# Patient Record
Sex: Female | Born: 2000 | Hispanic: No | Marital: Single | Smoking: Current some day smoker
Health system: Southern US, Community
[De-identification: ages and names within clinical notes are randomized; demographics above are authoritative.]

---
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
## 2020-02-07 ENCOUNTER — Ambulatory Visit (INDEPENDENT_AMBULATORY_CARE_PROVIDER_SITE_OTHER): Payer: Medicaid Other | Admitting: *Deleted

## 2020-02-07 ENCOUNTER — Other Ambulatory Visit: Payer: Self-pay

## 2020-02-07 DIAGNOSIS — Z3042 Encounter for surveillance of injectable contraceptive: Secondary | ICD-10-CM

## 2020-02-17 MED ORDER — MEDROXYPROGESTERONE ACETATE 150 MG/ML IM SUSP
150.0000 mg | Freq: Once | INTRAMUSCULAR | Status: AC
  Administered 2020-02-07: 16:00:00 150 mg via INTRAMUSCULAR

## 2020-02-17 NOTE — Progress Notes (Addendum)
Depo only. NDC [phone]

## 2020-04-24 ENCOUNTER — Ambulatory Visit: Payer: Medicaid Other

## 2020-05-01 ENCOUNTER — Other Ambulatory Visit (HOSPITAL_COMMUNITY)
Admission: RE | Admit: 2020-05-01 | Discharge: 2020-05-01 | Disposition: A | Discharge status: Home / Self Care | Payer: Medicaid Other | Source: Ambulatory Visit | Attending: Pediatrics | Admitting: Pediatrics

## 2020-05-01 ENCOUNTER — Ambulatory Visit (INDEPENDENT_AMBULATORY_CARE_PROVIDER_SITE_OTHER): Payer: Medicaid Other | Admitting: Pediatrics

## 2020-05-01 ENCOUNTER — Ambulatory Visit (INDEPENDENT_AMBULATORY_CARE_PROVIDER_SITE_OTHER): Payer: Medicaid Other | Admitting: Clinical

## 2020-05-01 ENCOUNTER — Other Ambulatory Visit: Payer: Self-pay

## 2020-05-01 VITALS — BP 124/80 | HR 98 | Ht 61.81 in | Wt 177.0 lb

## 2020-05-01 DIAGNOSIS — Z113 Encounter for screening for infections with a predominantly sexual mode of transmission: Secondary | ICD-10-CM | POA: Insufficient documentation

## 2020-05-01 DIAGNOSIS — F640 Transsexualism: Secondary | ICD-10-CM | POA: Diagnosis not present

## 2020-05-01 DIAGNOSIS — E349 Endocrine disorder, unspecified: Secondary | ICD-10-CM

## 2020-05-01 DIAGNOSIS — N946 Dysmenorrhea, unspecified: Secondary | ICD-10-CM | POA: Diagnosis not present

## 2020-05-01 DIAGNOSIS — Z789 Other specified health status: Secondary | ICD-10-CM

## 2020-05-01 DIAGNOSIS — Z3202 Encounter for pregnancy test, result negative: Secondary | ICD-10-CM | POA: Diagnosis not present

## 2020-05-01 DIAGNOSIS — Z3049 Encounter for surveillance of other contraceptives: Secondary | ICD-10-CM | POA: Diagnosis not present

## 2020-05-01 LAB — POCT URINE PREGNANCY: Preg Test, Ur: NEGATIVE

## 2020-05-01 MED ORDER — TESTOSTERONE CYPIONATE 200 MG/ML IM SOLN: INTRAMUSCULAR | 0 refills | Status: DC

## 2020-05-01 MED ORDER — "BD ECLIPSE SYRINGE 27G X 1/2"" 1 ML MISC": 3 refills | Status: DC

## 2020-05-01 MED ORDER — MEDROXYPROGESTERONE ACETATE 150 MG/ML IM SUSP
150.0000 mg | Freq: Once | INTRAMUSCULAR | Status: AC
  Administered 2020-05-01: 150 mg via INTRAMUSCULAR

## 2020-05-01 MED ORDER — "NEEDLE (DISP) 25G X 5/8"" MISC": 6 refills | Status: DC

## 2020-05-01 NOTE — Patient Instructions (Addendum)
Top surgery: Please call [facility] for consult (Dr Ruben Im, [phone]).    Table 12.  Masculinizing Effects in Transgender Males  Effect      Onset    Maximum  Skin oiliness/acne    1-6 mo   1-2 y  Facial/body hair growth   6-12 mo   4-5 y  Scalp hair loss    6-12 mo   Increased muscle mass/strength  6-12 mo   2-5 y  Fat redistribution    1-6 mo   2-5 y  Cessation of menses    1-6 mo   Clitoral enlargement    1-6 mo   1-2 y  Vaginal atrophy    1-6 mo   1-2 y  Deepening of voice    6-12 mo   1-2 y   Estimates based on clinical observations found in multiple studies following transmen on testosterone therapy    TESTOSTERONE THERAPY INFORMATION  This form refers to the use of testosterone by persons who wish to become more masculinized as part of a gender transitioning process.  Please initial next to each statement on this form to indicate that the changes and the risks of using testosterone have been explained to you and that you understand them. If you have any questions or concerns about this information, you are encouraged to take the time you need to ask questions and to think about the potential effects of this treatment before proceeding.  IF YOU DO NOT UNDERSTAND THIS INFORMATION, STOP AND ASK QUESTIONS  Please initial each section below to indicate that you understand and agree with the statements.  ____  I have been informed that the masculinizing effects of testosterone therapy may take several months to become noticeable and several years to be complete. Some of these changes will be permanent including: - Possible hair loss, especially at my temples and crown of my head, possibly female pattern baldness - Facial hair growth (i.e., beard, mustache) - Deepening of my voice - Increased body hair growth (i.e., on arms, legs, chest, back, buttocks, and abdomen, etc.) - Enlargement of my clitoris  ____  If I stop taking testosterone, some of the changes  caused by testosterone will not be permanent. If I stop taking testosterone, the following body changes due to testosterone therapy may go away: - Redistribution of fat to a female pattern (i.e., abdominal fat may increase while fat in the breasts, buttocks, and thighs may decrease) - Increased muscle development - Increased red blood cells - Increased sex drive and energy levels. Possible increased feelings of aggression or anger - Acne, which may become severe - Cessation of menstrual cycles (periods) and suspended ovulation, including changes to/thinning of your vaginal tissue/lining leading to increased potential for easy damage, dryness, or yeast infections  ____  I understand that is it not known exactly what the effects of testosterone are on fertility. Even if I stop therapy, I may not be able to become pregnant in the future. I have been advised to have a consultation with a reproductive medicine doctor regarding gamete (egg) banking if this is a concern of mine.  ____  I understand that brain structures are affected by testosterone and estrogen. The long term effects of changing the levels of one's estrogen levels through the use of testosterone therapy have not been scientifically studied and are impossible to predict. These effects may be beneficial, damaging, or both.  ____  I understand that everyone's body is different and that  there is no way to predict what my response to hormones will be. I also understand that the right dosage for me may not be the same as for someone else. I further understand that I must follow my prescribed regimen of testosterone treatment to continue to receive hormone therapy at this clinic.  ____  I will have complete physical examinations and lab tests periodically as required to make sure I am not having an adverse reaction to testosterone and to continue good health care. I understand that this is required to continue testosterone therapy at this health  center.  ____  I have been informed that using testosterone may increase my risk of developing diabetes in the future.  ____  I understand that the endometrium (lining of the uterus) is able to turn testosterone into estrogen and may increase the risk of cancer of the endometrium. Not having periods may increase this risk. Continued pelvic exams and cervical cancer screenings are strongly recommended unless there has been a removal of the ovaries, uterus, and cervix.  ____  I understand that testosterone therapy should not be relied upon to prevent pregnancy. Even if periods stop, I should use a barrier method of birth control during sex where semen could enter the vagina or uterus.  ____  I understand the effects of testosterone therapy alone will not provide protection from sexually transmitted diseases or HIV. Use of barriers and safer sexual practices are recommended to reduce chances of infections.  ____  I understand that the effects of testosterone therapy do not provide protection from cervical or breast cancer. Annual breast exams, monthly self-exams, and annual mammograms/cancer screenings after the age of 43 are highly recommended even after chest reconstruction.  ____  I have been informed that testosterone may lead to liver damage. I have been informed that I will be monitored for liver problems before starting testosterone therapy and periodically during therapy.  ____  I have been informed that testosterone may cause changes in my cholesterol to increase my risk of heart attack or stroke in the future, and that my physician will monitor cholesterol levels regularly.  ____  I understand that testosterone therapy may cause changes in my emotions and moods and that my providers can assist me to find support services and other resources to explore and cope with these changes.  ____  I agree that if I have any adverse reactions or side effects to testosterone, I will inform my health care  provider.  ____  I agree to tell my provider about any non-clinic hormones, dietary supplements, herbs, recreational drugs, or medications I might be taking. Sharing this information will help my provider prevent potentially harmful interactions. I have been informed that staff will continue to provide me with medical care, regardless of what information I share with them.  ____  I agree to take testosterone as prescribed and to inform my provider of any problems or dissatisfaction I may have with my treatment. I understand that if I take too much testosterone my body may convert it to estrogen.  ____  I understand that there are medical conditions that could make taking testosterone either dangerous or physically damaging. I agree that if clinic staff suspect I may have any condition that could be dangerous to me, I will be evaluated for it before the decision to start or continue testosterone therapy is made. I understand that if I do not agree to be evaluated, my prescription for testosterone may be cancelled or refused.  ____  I understand that I can choose to stop taking testosterone at any time. I also understand that my provider can discontinue treatment for clinical reasons. I agree to follow a prescribed reduction plan if either of these situations occurs to reduce negative and potentially harmful side effects that may occur if I suddenly stop taking testosterone.   Many people are eager for hormonal changes to take place rapidly--I understand that. But it's very important to remember that the extent of, and rate at which your changes take place, depend on many factors. These factors include your genetics, the age at which you start taking hormones, and your overall state of health.  Consider the effects of hormone therapy as a second puberty, and puberty normally takes several years for the full effects to be seen. Taking higher doses of hormones will not necessarily bring about faster  changes, but it could endanger your health. And because everyone is different, your medicines or dosages may vary widely from those of your friends, or what you may have read in books or online.  There are four areas where you can expect changes to occur as your hormone therapy progresses.  The first is physical.  The first changes you will probably notice are that your skin will become a bit thicker and more oily. Your pores will become larger and there will be more oil production. You may develop acne, which in some cases can be bothersome or severe, but can be managed with good skin care practices and common acne treatments. You'll also notice that the odors of your sweat and urine will change and that you may sweat more overall.  When you touch things, they may "feel different" and you may perceive pain and temperature differently.  Your breasts will not change much during transition, though you may notice some breast pain, or a slight decrease in size. For this reason, some breast surgeons recommend waiting at least six months after the start of testosterone therapy before having chest reconstructive surgery.  Your body will begin to redistribute your weight. Fat will diminish somewhat around your hips and thighs. Your arms and legs will develop more muscle definition, and a slightly rougher appearance, as the fat just beneath the skin becomes a bit thinner. You may also gain fat around your abdomen, otherwise known as your "gut."  Your eyes and face will begin to develop a more angular, female appearance as facial fat decreases and shifts. Please note that it's not likely your bone structure will change, though some people in their late teens or early twenties may see some subtle bone changes. It may take 2 or more years to see the final result of the facial changes.  Your muscle mass will increase, as will your strength, although this will depend on a variety of factors including diet and  exercise. Overall, you may gain or lose weight once you begin hormone therapy, depending on your diet, lifestyle, genetics and muscle mass.  Testosterone will cause a thickening of the vocal chords, which will result in a more female-sounding voice. Not all transmen will experience a full deepening of their voice with testosterone, and some men may find that practicing various vocal techniques or working with a speech therapist may help them develop a voice that feels more comfortable and fitting. Voice changes may begin within just a few weeks of beginning testosterone, first with a scratchy sensation in the throat or feeling like you are hoarse. Next your voice may break  a bit as it finds its new tone and quality.  Let's talk about hair. The hair on your body, including your chest, back and arms will increase in thickness, become darker and will grow at a faster rate. You may expect to develop a pattern of body hair similar to other men in your family--just remember, though, that everyone is different and it can take 5 or more years to see the final results.  Regarding the hair on your head: most trans men notice some degree of frontal scalp balding, especially in the area of your temples. Depending on your age and family history, you may develop thinning hair, female pattern baldness or even complete hair loss.  Lastly, everyone is curious to know about facial hair. Beards vary from person to person. Some people develop a thick beard quite rapidly, others take several years, while some never develop a full and thick beard. This is a result of genetics and the age at which you start testosterone therapy. Non-transgender men have varying degrees of facial hair thickness and develop it at varying ages, just as with trans men.  The second impact of hormone therapy is on your emotional state.  Puberty is a roller coaster of emotions and the second puberty that you will experience during your transition is no  exception. You may find that you have access to a narrower range of emotions or feelings, or have different interests, tastes or pastimes, or behave differently in relationships with people.  Psychotherapy is not for everyone, but most people in transition will benefit from counseling that helps them get to know their new body and self while exploring their new thoughts and feelings.  The third impact of hormone therapy is sexual in nature.  Soon after beginning hormone treatment, you will likely notice a change in your libido. Quite rapidly, your clitoris will begin to grow and become even larger when you are aroused. You may find that different sex acts or different parts of your body bring you erotic pleasure. Your orgasms will feel different, with perhaps more peak intensity and a greater focus on your genitals rather than a whole body experience. Some people find that their sexual orientation may change when taking testosterone; it is best to explore these new feelings rather than keep them bottled up.  Don't be afraid to explore and experiment with your new sexuality through masturbation and with sex toys. Involve your sexual partner if you have one.  The fourth impact of hormone therapy is on the reproductive system.  You may notice at first that your periods become lighter, arrive later, or are shorter in duration, though some may notice heavier or longer lasting periods for a few cycles before they stop altogether.  Testosterone greatly reduces your ability to become pregnant but it does not completely eliminate the risk of pregnancy. Transgender men can become pregnant while on testosterone, so if you remain sexually active with a non-transgender man, you should always use a method of birth control to prevent unwanted pregnancy.  If you suspect you may have become pregnant, discontinue testosterone treatment and see your provider as soon as possible, as testosterone can endanger the  fetus.  If you do want to have a pregnancy, you'll have to stop testosterone treatment and wait until your doctor tells you that it's okay to begin trying to conceive. It's also important to know that, depending on how long you've been on testosterone therapy, it may become difficult for your ovaries to release  eggs, and you may need to use fertility drugs or expensive techniques such as in vitro fertilization to become pregnant. It is also possible testosterone therapy may have caused you to completely lose the ability to become pregnant. Freezing fertilized eggs is a possibility but is very expensive and not always effective.  Let's talk about some of the risks associated with testosterone therapy.  If you miss a dose of testosterone or change your dosage, you may experience a small amount of spotting or bleeding. However if your periods have stopped, be sure to report any return of bleeding or spotting to your doctor, who may request an ultrasound to be certain the bleeding isn't a symptom of an imbalance of the lining of the uterus. Sometimes such an imbalance could lead to a precancerous condition, although this is extremely rare in transgender men. Some men may experience a return of spotting or heavier bleeding after months or even years of testosterone treatment. In most cases this represents changes in the body's metabolism over time. To be safe, always discuss any new or changes in bleeding patterns with your doctor.   It is unclear if testosterone treatment causes an increased risk of ovarian cancer. Ovarian cancer is difficult to screen for, and most cases of ovarian cancer are discovered after it is too late to be treated. A periodic pelvic examination, where your doctor uses a gloved hand to examine your vagina, uterus and ovaries is recommended to help detect this condition.  Your risk of cervical cancer, or HPV, relates to your past and current sexual practices, but even people who have  never had a penis in contact with their vagina may still contract an HPV infection. The HPV vaccine, can greatly reduce your risk of cervical cancer, and you may want to discuss this with your provider. Pap smears are used to detect cervical cancer or precancer conditions such as an HPV infection. Your provider will make a recommendation as to how often you should have a pap smear. It is unclear if testosterone therapy plays any role in HPV infections or cervical cancer.  Some experts recommend a full hysterectomy which would include removal of the uterus, ovaries, and fallopian tubes--5-10 years after beginning testosterone treatment to minimize the risk of cancer and eliminate the need for screening.  Testosterone treatment does not seem to significantly increase the risk of breast cancer, but there's not enough research to be certain. However it is still important to receive periodic mammograms or other screening procedures as recommended by your doctor. After breast removal surgery, there is still a small amount of breast tissue left behind. It may be difficult to screen this small amount of tissue for breast cancer, though there are almost no cases of breast cancer in transgender men after chest reconstruction surgery.  As a result of your testosterone treatment, your overall health risk profile will change to that of a man. Your risk of heart disease, diabetes, high blood pressure, and high cholesterol may go up, though these risks may still be less than a non-transgender man's risks. Since men on average live about 5 years less than women, you could theoretically be shortening your lifespan, though there is not enough research data to know for sure. Fortunately, since you do not have a prostate, you have no risk of prostate cancer and there is no need to screen for this condition.  Testsosterone can make your blood become too thick, otherwise known as a high hematocrit count, which can cause a  stroke,  heart attack or other conditions. This can be a particular problem if you are taking a dose that is too high for your body's metabolism. Your cholesterol could potentially increase when taking testosterone. Your doctor will perform periodic tests of your blood count, cholesterol, kidney functions, and liver functions, and a diabetes screening test in order to closely monitor your therapy. Though it's not necessary to routinely check your testosterone level, which is an expensive process, your doctor may choose to check it for a variety of reasons - usually if you are having unpleasant symptoms or ongoing bleeding.  Some of the effects of hormone therapy are reversible, if you stop taking them. The degree to which they can be reversed depends on how long you have been taking testosterone. Clitoral growth, facial hair growth, voice changes and female-pattern baldness are not reversible.  If you have had your ovaries removed, it is important to remain on at least a low dose of hormones post-op until you're at least 50 and perhaps older to prevent a weakening of the bones, otherwise known as osteoporosis.  Those are many of the risks for you to consider and discuss with your doctor should you have any questions. Now let's discuss some practicalities of hormone therapy.  Testosterone comes in several forms. Most transgender men use an injectable form to start. Some chose to begin on a lower dose and increase slowly, while others chose to begin at a standard dose. Both approaches have their pros and cons; you should discuss with your doctor the best option for you. Testosterone levels tend to be most even, over time, when the injections are given weekly.  In addition to injections, there are also transdermal forms of testosterone, including patches, gels, and creams. In some men these forms cause changes to progress at a slower pace.  Reagrdless of the type of testosterone you are taking, it's important to know  that taking more testosterone will not make your changes progress more quickly, but could cause serious health complications. Excess testosterone can be converted to estrogen, which may increase your risks of uterine imbalance or cancer. It can also make you feel anxious or agitated, and cause your cholesterol or blood count to get too high.
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
## 2020-05-01 NOTE — Progress Notes (Signed)
Supervising Provider Co-Signature.  I saw and evaluated the patient, performing the key elements of the service.  I developed the management plan that is described in the resident's note, and I agree with the content. 19 yo AFAB-IAM presents for gender affirming care. Local resources for top surgery provided. Name change process information shared. Discussed risks, benefits and side effects of weekly SQ testosterone. Start at 50 mg SQ weekly and titrate q 3 months as needed. Labs in 11/2019 wnl. Repeat at follow-up in 3-4 months. Patient going to college in [place]. Will identify gender affirming resources in [place] to access if needed. Return asap with testosterone for injection teaching.  Owens Shark, MD Adolescent Medicine Specialist

## 2020-05-01 NOTE — Progress Notes (Signed)
THIS RECORD MAY CONTAIN CONFIDENTIAL INFORMATION THAT SHOULD NOT BE RELEASED WITHOUT REVIEW OF THE SERVICE PROVIDER.  Adolescent Medicine Consultation Initial Visit Laurie [name]  is a 19 y.o. adult referred by Ettefagh, Paul Dykes, MD here today for evaluation of hormone therapy.      Review of records?  yes  Pertinent Labs? Yes  Growth Chart Viewed? yes   History was provided by the patient.   Team Care Documentation:  Team care member assisted with documentation during this visit? no If applicable, list name(s) of team care members and location(s) of team care members: na  Chief complaint: hormone therapy  HPI:   PCP Confirmed?  yes    Discussed masculinizing effects of hormones (below). Laurie [name] states that all these are consistent with his goals. Skin oiliness/acne   Facial/body hair growth  Scalp hair loss    Increased muscle mass/strength Fat redistribution   Cessation of menses     Clitoral enlargement   Vaginal atrophy   Deepening of voice    Interested in top surgery. Not interested in bottom surgery.  Egg preservation? Unsure if interested  Missed depo last week -- given today.  Interested in legal name change.  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 Leaves Aug 17.  Lives with grandparents. Met with Jasmine today -- see note.  Patient's personal or confidential phone number: not obtained  No LMP recorded.  Review of Systems:  Negative unless stated above  No Known Allergies Current Outpatient Medications on File Prior to Visit  Medication Sig Dispense Refill  . triamcinolone cream (KENALOG) 0.1 % Apply 1 application topically 2 (two) times daily. (Patient not taking: Reported on 09/28/2019) 30 g 0   No current facility-administered medications on file prior to visit.    Patient Active Problem List   Diagnosis Date Noted  . Endocrine disorder, unspecified 11/21/2019  . Menstrual cramps 02/08/2016  . Frequent headaches  12/21/2014  . BMI (body mass index), pediatric, greater than or equal to 95% for age 39/07/2015    Past Medical History:  Reviewed and updated?  yes Past Medical History:  Diagnosis Date  . Otorrhea of both ears 09/28/2018  . Prediabetes 02/08/2016  . Vitamin D deficiency 12/21/2014    Family History: Reviewed and updated? yes Family History  Problem Relation Age of Onset  . Cancer Maternal Grandmother   . Alcohol abuse Maternal Grandmother   . Diabetes Maternal Grandfather   . Heart disease Maternal Grandfather   . Hyperlipidemia Maternal Grandfather   . Hypertension Maternal Grandfather   . Alcohol abuse Maternal Grandfather   . Obesity Paternal Grandmother   . Hyperlipidemia Paternal Grandmother   . Hypertension Paternal Grandmother   . Obesity Paternal Grandfather   . Heart disease Paternal Grandfather        A fib related to history of rheumatic heart disease  . Diabetes Paternal Grandfather   . Asthma Neg Hx     Social History:  School:  School: will be attending [employer] [place] in [place]. Moving Aug 17.   Confidentiality was discussed with the patient and if applicable, with caregiver as well.  Gender identity: female Sex assigned at birth: female   Physical Exam:  Vitals:   05/01/20 1035  BP: 124/80  Pulse: 98  Weight: 177 lb (80.3 kg)  Height: 5' 1.81" (1.57 m)   BP 124/80   Pulse 98   Ht 5' 1.81" (1.57 m)   Wt 177 lb (80.3 kg)  BMI 32.57 kg/m  Body mass index: body mass index is 32.57 kg/m. Blood pressure percentiles are not available for patients who are 18 years or older.   Physical Exam  General: well appearing, no distress HEENT: sclera white, mucus membranes moist, no oral lesions CV: RRR, no murmurs, CR 2 sec RESP: no tachypnea, no increased WOB, lungs CTAB ABD: BS+, soft, nontender, nondistended, no masses EXT: no cyanosis, no swelling NEURO: appropriate mentation, no focal deficits SKIN: acne  Assessment/Plan:  1.  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   2. Endocrine disorder, unspecified - medroxyPROGESTERone (DEPO-PROVERA) injection 150 mg  3. Menstrual cramps - medroxyPROGESTERone (DEPO-PROVERA) injection 150 mg  4. Routine screening for STI (sexually transmitted infection) - Urine cytology ancillary only  5. Pregnancy examination or test, negative result - POCT urine pregnancy     BH screenings:  PHQ-SADS Last 3 Score only 05/01/2020 05/04/2018  PHQ-15 Score 0 -  Total GAD-7 Score 0 -  PHQ-9 Total Score 0 0   Screens discussed with patient and parent and adjustments to plan made accordingly.   Follow-up:   No follow-ups on file.   Medical decision-making:  >45 minutes spent face to face with patient with more than 50% of appointment spent discussing diagnosis, management, follow-up, and reviewing of chart.  CC: Ettefagh, Paul Dykes, MD, Ettefagh, Paul Dykes, MD

## 2020-05-02 LAB — URINE CYTOLOGY ANCILLARY ONLY
Bacterial Vaginitis-Urine: NEGATIVE
Candida Urine: NEGATIVE
Chlamydia: NEGATIVE
Comment: NEGATIVE
Comment: NEGATIVE
Comment: NORMAL
Neisseria Gonorrhea: NEGATIVE
Trichomonas: NEGATIVE

## 2020-05-08 ENCOUNTER — Ambulatory Visit (INDEPENDENT_AMBULATORY_CARE_PROVIDER_SITE_OTHER): Payer: Medicaid Other | Admitting: Pediatrics

## 2020-05-08 ENCOUNTER — Other Ambulatory Visit: Payer: Self-pay

## 2020-05-08 VITALS — BP 124/74 | HR 93 | Ht 61.81 in | Wt 177.0 lb

## 2020-05-08 DIAGNOSIS — E349 Endocrine disorder, unspecified: Secondary | ICD-10-CM

## 2020-05-09 NOTE — Progress Notes (Signed)
Pt presents for teaching for testosterone injections. Handout for SQ injection technique provided. Demonstrated how to draw up, clean site and inject. Tolerated well in RLQ of abdomen. Feels confident injecting it by himself next week and will call with questions or concerns. 50 mg SQ administered.

## 2020-07-16 ENCOUNTER — Ambulatory Visit: Payer: Medicaid Other

## 2020-08-15 ENCOUNTER — Telehealth (INDEPENDENT_AMBULATORY_CARE_PROVIDER_SITE_OTHER): Payer: Medicaid Other | Admitting: Pediatrics

## 2020-08-15 DIAGNOSIS — E349 Endocrine disorder, unspecified: Secondary | ICD-10-CM

## 2020-08-15 DIAGNOSIS — Z3042 Encounter for surveillance of injectable contraceptive: Secondary | ICD-10-CM | POA: Diagnosis not present

## 2020-08-15 MED ORDER — MEDROXYPROGESTERONE ACETATE 150 MG/ML IM SUSP: 150.0000 mg | INTRAMUSCULAR | 3 refills | Status: DC

## 2020-08-15 MED ORDER — TESTOSTERONE CYPIONATE 200 MG/ML IM SOLN: INTRAMUSCULAR | 0 refills | Status: DC

## 2020-08-15 NOTE — Progress Notes (Signed)
Virtual Visit via Video Note  I connected with Marlon Gandolfi "[name]" on 08/15/20 at  3:00 PM EDT by a video enabled telemedicine application and verified that I am speaking with the correct person using two identifiers.   Location of patient/parent: College.    I discussed the limitations of evaluation and management by telemedicine and the availability of in person appointments.  I discussed that the purpose of this telehealth visit is to provide medical care while limiting exposure to the novel coronavirus.   I advised the patient  that by engaging in this telehealth visit, they consent to the provision of healthcare.  Additionally, they authorize for the patient's insurance to be billed for the services provided during this telehealth visit.  They expressed understanding and agreed to proceed.  Reason for visit:  Shwanda Reta  is a 19 y.o. adult female to female transgender here after starting testosterone injections.    Plan at last adolescent specialty clinic visit included: started testosterone injections, received counseling, plan to call Top surgery: Please call [facility] for consult (Dr Ruben Im, [phone]).  History of Present Illness:  No concerns today, doing well.  Taking injections since August 4th, about 12 injections so far.  Requesting refill of testosterone injection. In college in [place]. Hasn't called the surgeon yet, wants top surgery in the summer.  Since injections has noticed thicker hair on extremities and "bigger bottom".  Depot last on August 4th.   No more periods or menstrual cramping.   Pertinent Labs? Negative STI and pregnancy screenings at last visit.  Growth Chart Viewed? yes  No LMP recorded. No Known Allergies Current Outpatient Medications on File Prior to Visit  Medication Sig Dispense Refill  . NEEDLE, DISP, 25 G 25G X 5/8" MISC Use 1 needle weekly for subcutaneous testosterone injection 15 each 6  . Syringe/Needle, Disp, (B-D  ECLIPSE SYRINGE) 27G X 1/2" 1 ML MISC Use 1 syringe and needle weekly for testosterone injection 15 each 3   No current facility-administered medications on file prior to visit.    Patient Active Problem List   Diagnosis Date Noted  . Endocrine disorder, unspecified 11/21/2019  . Menstrual cramps 02/08/2016  . Frequent headaches 12/21/2014  . BMI (body mass index), pediatric, greater than or equal to 95% for age 67/07/2015   The following portions of the patient's history were reviewed and updated as appropriate: allergies, current medications, past family history, past medical history, past social history, past surgical history, and problem list.  Visual Observations/Objective:  General Appearance: Well nourished well developed, in no apparent distress.  Eyes: conjunctiva no swelling or erythema ENT/Mouth: No hoarseness, No cough for duration of visit.  Neck: Supple  Respiratory: Respiratory effort normal, normal rate, no retractions or distress.   Cardio: Appears well-perfused, noncyanotic Musculoskeletal: no obvious deformity Skin: visible skin without rashes, ecchymosis, erythema Neuro: Awake and oriented X 3, Psych:  normal affect, Insight and Judgment appropriate.   Assessment and Plan:  Teniyah Berrocal  is a 19 y.o. female to female transgender adult here for follow up after starting testosterone injections. No concerns. Medications refilled today. Plan to visit again on December 30th when returning home from college.   1. Female-to-female transgender person - Pt plans to call in the summer for top surgery consult in [place] [facility] Ruben Im, [phone]) - Refilled testosterone cypionate (DEPOTESTOSTERONE CYPIONATE) 200 MG/ML injection; 50 mg (0.25 ML) subcutaneous injection under the skin weekly.  - Routine labs, depo,  and Testosterone dose increase to be given at December 30 visit. Increase to 100-200 mg weekly for physiologic 350-700 testosterone.     2. Endocrine disorder, unspecified - medroxyPROGESTERone (DEPO-PROVERA) injection 150 mg  Follow Up Instructions: Follow-up:  Return in about 8 weeks (around 10/11/2020).    I discussed the assessment and treatment plan with the patient and/or parent/guardian. They were provided an opportunity to ask questions and all were answered. They agreed with the plan and demonstrated an understanding of the instructions.   They were advised to call back or seek an in-person evaluation in the emergency room if the symptoms worsen or if the condition fails to improve as anticipated.  Time spent reviewing chart in preparation for visit:  10 minutes Time spent face-to-face with patient: 15 minutes Time spent not face-to-face with patient for documentation and care coordination on date of service: 10 minutes  I was located at personal office in home during this encounter.  Jimmy Footman, MD

## 2020-08-18 NOTE — Progress Notes (Signed)
I have reviewed the resident's note and plan of care and helped develop the plan as necessary.  Discussed depo injection as well as getting labs for T when he returns. He was in agreement and overall doing very well.    Alfonso Ramus, FNP

## 2020-08-29 ENCOUNTER — Other Ambulatory Visit: Payer: Self-pay | Admitting: Pediatrics

## 2020-08-29 DIAGNOSIS — E349 Endocrine disorder, unspecified: Secondary | ICD-10-CM

## 2020-08-29 MED ORDER — TESTOSTERONE CYPIONATE 200 MG/ML IM SOLN: INTRAMUSCULAR | 0 refills | Status: DC

## 2020-09-24 ENCOUNTER — Ambulatory Visit: Payer: Medicaid Other | Admitting: Pediatrics

## 2020-10-01 ENCOUNTER — Other Ambulatory Visit: Payer: Self-pay | Admitting: Pediatrics

## 2020-10-01 DIAGNOSIS — E349 Endocrine disorder, unspecified: Secondary | ICD-10-CM

## 2020-10-01 MED ORDER — TESTOSTERONE CYPIONATE 200 MG/ML IM SOLN: INTRAMUSCULAR | 0 refills | Status: DC

## 2020-10-08 ENCOUNTER — Other Ambulatory Visit: Payer: Self-pay | Admitting: Family

## 2020-10-08 MED ORDER — "BD ECLIPSE SYRINGE 27G X 1/2"" 1 ML MISC": 3 refills | Status: DC

## 2020-10-11 ENCOUNTER — Ambulatory Visit: Payer: Medicaid Other | Admitting: Pediatrics

## 2020-10-18 ENCOUNTER — Ambulatory Visit (INDEPENDENT_AMBULATORY_CARE_PROVIDER_SITE_OTHER): Payer: Medicaid Other | Admitting: Pediatrics

## 2020-10-18 ENCOUNTER — Other Ambulatory Visit: Payer: Self-pay

## 2020-10-18 ENCOUNTER — Encounter: Payer: Self-pay | Admitting: Pediatrics

## 2020-10-18 VITALS — BP 124/71 | HR 96 | Ht 61.81 in | Wt 184.4 lb

## 2020-10-18 DIAGNOSIS — F649 Gender identity disorder, unspecified: Secondary | ICD-10-CM

## 2020-10-18 NOTE — Progress Notes (Signed)
THIS RECORD MAY CONTAIN CONFIDENTIAL INFORMATION THAT SHOULD NOT BE RELEASED WITHOUT REVIEW OF THE SERVICE PROVIDER.  Adolescent Medicine Consultation Follow-Up Visit Laurie [name]  is a 20 y.o. female to female transgender adult referred by Ettefagh, Aron Baba, MD here today for follow-up regarding testosterone hormone therapy.    Pertinent Labs? No   History was provided by the patient. Interpreter? no  Chief complaint: Hormone therapy follow-up  HPI:   PCP Confirmed? No, has access to student health on college campus No questions or concerns today.   He is primarily looking for a deeper voice, but his voice is not much deeper. Noticing clitoris enlarging and it is sensitive if it is bumped. Arm hair and leg hair getting a little thicker. No breakthrough menses.  He rotates testosterone injection sites between thigh and stomach.  Going to college in [place], going well. Feels safe at school. Taking care of himself- eating fruits, veggies, drinks primarily water and goes to gym ~4 days a week. Mood is good.   Plan at last adolescent specialty clinic visit included continuing testosterone 50mg  weekly, routine labs today to see how much able to increase testosterone.  He had two doses of Moderna vaccine in May 2021. Knows he is due for the booster.   My Chart Activated?   yes   No LMP recorded. No Known Allergies Current Outpatient Medications on File Prior to Visit  Medication Sig Dispense Refill  . medroxyPROGESTERone (DEPO-PROVERA) 150 MG/ML injection Inject 1 mL (150 mg total) into the muscle every 3 (three) months. 1 mL 3  . NEEDLE, DISP, 25 G 25G X 5/8" MISC Use 1 needle weekly for subcutaneous testosterone injection 15 each 6  . Syringe/Needle, Disp, (B-D ECLIPSE SYRINGE) 27G X 1/2" 1 ML MISC Use 1 syringe and needle weekly for testosterone injection 15 each 3  . testosterone cypionate (DEPOTESTOSTERONE CYPIONATE) 200 MG/ML injection Inject 50 mg (0.25 mL) subcutaneously once  weekly. 10 mL 0   No current facility-administered medications on file prior to visit.    Patient Active Problem List   Diagnosis Date Noted  . Endocrine disorder, unspecified 11/21/2019  . Menstrual cramps 02/08/2016  . Frequent headaches 12/21/2014  . BMI (body mass index), pediatric, greater than or equal to 95% for age 68/07/2015     Activities:  Studying ecology and wild life studies in 02/20/2015  Lifestyle habits that can impact QOL: Sleep:Regular, 8 to 9 hours. No issues Eating habits/patterns: In [place], vegetarian Water intake: primarily drinks water Body Movement: Gym in [place], goes 4 times a week  Changes at home or school since last visit:  no  Tobacco?  no Drugs/ETOH?  yes, marijuana 2x month. Legal in [place], friend grows it legally.  Partner preference?  both  Sexually Active?  no, not currently; depo-provera for pregnancy prevention  Suicidal or homicidal thoughts?   no Self injurious behaviors?  no   The following portions of the patient's history were reviewed and updated as appropriate: allergies, current medications, past medical history, past social history and problem list.  Physical Exam:  Vitals:   10/18/20 1020  BP: 124/71  Pulse: 96  Weight: 184 lb 6.4 oz (83.6 kg)  Height: 5' 1.81" (1.57 m)   BP 124/71   Pulse 96   Ht 5' 1.81" (1.57 m)   Wt 184 lb 6.4 oz (83.6 kg)   BMI 33.93 kg/m  Body mass index: body mass index is 33.93 kg/m. Blood pressure percentiles are not available for patients who  are 18 years or older.  Physical Exam Constitutional:      General: He is not in acute distress.    Appearance: Normal appearance. He is not ill-appearing.  HENT:     Head: Normocephalic and atraumatic.     Mouth/Throat:     Mouth: Mucous membranes are moist.     Pharynx: Oropharynx is clear.  Eyes:     Conjunctiva/sclera: Conjunctivae normal.     Pupils: Pupils are equal, round, and reactive to light.  Cardiovascular:     Rate and Rhythm: Normal rate and  regular rhythm.     Heart sounds: No murmur heard.   Pulmonary:     Effort: Pulmonary effort is normal. No respiratory distress.     Breath sounds: Normal breath sounds.  Abdominal:     Palpations: Abdomen is soft.  Musculoskeletal:     Cervical back: Neck supple.  Skin:    Capillary Refill: Capillary refill takes less than 2 seconds.     Comments: Facial acne present  Neurological:     Mental Status: He is alert.  Psychiatric:        Mood and Affect: Mood normal.        Behavior: Behavior normal.     Comments: Denies depressed mood, SI, SIB     Assessment/Plan:  Laurie [name] is an 20 y/o female to female transgender person presenting for follow-up of testosterone therapy. He is tolerating the testosterone therapy well, primarily looking for voice deepening. Understands not yet to expect this given shorter duration of testosterone therapy to date. Also suggested pursuing voice therapy if interested.   Diagnoses and all orders for this visit:  Gender dysphoria -     Testosterone -     Hemoglobin and hematocrit, blood - Will increase testosterone from 50mg  to either 100 or 75mg  weekly pending lab results   BH screenings:  PHQ-SADS Last 3 Score only 10/18/2020 05/01/2020 05/04/2018  PHQ-15 Score - 0 -  Total GAD-7 Score - 0 -  PHQ-9 Total Score 0 0 0   Screens performed during this visit were discussed with patient and parent and adjustments to plan made accordingly.   Follow-up:  05/03/2020 will contact 05/06/2018 via MyChart message about how he is doing and when he will be back in [place], anticipate next in-person visit will be during summer break. 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   Laurie Jordan MD, MPH PGY-3 Pediatrics  Medical decision-making:  >25 minutes spent face to face with patient with more than 50% of appointment spent discussing diagnosis, management, follow-up, and reviewing of testosterone therpy.

## 2020-10-18 NOTE — Patient Instructions (Addendum)
We will contact you with lab results and let you know how much we can increase testosterone dosing.  Please look for a Moderna booster shot at a local pharmacy.   Anticipated Changes with Testosterone:    Summary of Changes from the [facility] Guidelines:  [email or web address]  As you prepare to begin treatment, now is a great time to think through what your goals are, as the approach to hormone therapy is definitely not one-size-fits-all. Do you want to get started right away on a path to the maximum safe effects? Or, do you want to begin at a lower dose and allow things to progress more slowly? Perhaps your long term goal is to seek less-than-maximal effects and you would like to remain on a low dose for the long term. Thinking about your goals will help you communicate more effectively with your medical provider as you work together to map out your care plan.  Many people are eager for hormonal changes to take place rapidly- understandably so. But it's very important to remember that the extent of, and rate at which your changes take place, depend on many factors. These factors include your genetics, the age at which you start taking hormones, and your overall state of health.  Consider the effects of hormone therapy as a second puberty, and puberty normally takes years for the full effects to be seen. Taking higher doses of hormones will not necessarily bring about faster changes, but it could endanger your health. And because everyone is different, your medicines or dosages may vary widely from those of your friends, or what you may have read in books or in online forums. Use caution when reading about hormone regimens that promise specific, rapid, or drastic effects. While it is possible to make adjustments in medications and dosing to achieve certain specific goals, in large part the way your body changes in response to hormones is more dependent on genetics and the age at which you  start, rather than the specific dose, route, frequency, or types of medications you are taking.  While the approach to hormone therapy in transgender men is described here, it is also applicable to non-binary people who were assigned female at birth and are seeking masculinizing hormone therapy.  Physical Changes  The first physical changes you will probably notice are that your skin will become a bit thicker and more oily. Your pores will become larger and there will be more oil production. You'll also notice that the odors of your sweat and urine will change and that you may sweat more overall. You may develop acne, which in some cases can be bothersome or severe, but usually can be managed with good skin care practices and common acne treatments. Some people may require prescription medications to manage acne, please discuss this with your provider. Generally, acne severity peaks during the first year of treatment, and then gradually improves. Acne may be minimized by using an appropriate dosing of testosterone that avoids excessively high levels.  Your chest will not change much in response to testosterone therapy. That said, surgeons often recommend waiting at least 6-12 months after the start of testosterone therapy before having masculinizing chest surgery, otherwise known as top surgery, in order to first allow the contours of the muscles and soft tissues of your chest wall to settle in to their new pattern.  Your body will begin to redistribute your weight. Fat will diminish somewhat around your hips and thighs. Your arms and legs will develop more  muscle definition, with more prominent veins and a slightly rougher appearance, as the fat just beneath the skin becomes a bit thinner. You may also gain fat around your abdomen.  Your eyes and face will begin to develop a more angular, female appearance as facial fat decreases and shifts. Please note that it's not likely your bone structure will  change, though some people in their late teens or early twenties may see some subtle bone changes. It may take 2 or more years to see the final result of the facial changes.  Your muscle mass will increase, as will your strength, although this will depend on a variety of factors including diet and exercise. Overall, you may gain or lose weight once you begin hormone therapy, depending on your diet, lifestyle, genetics and muscle mass.  Testosterone will cause a thickening of the vocal chords, which will result in a more female-sounding voice. Not all trans men will experience a full deepening of the pitch of their voice with testosterone, however. Some may find that practicing various vocal techniques or working with a speech therapist may help them develop a voice that feels more comfortable and fitting. Voice changes may begin within just a few weeks of beginning testosterone, first with a scratchy sensation in the throat or feeling like you are hoarse. Next your voice may break a bit as it finds its new tone and quality.  The hair on your body, including your chest, back and arms will increase in thickness, become darker and will grow at a faster rate. You may expect to develop a pattern of body hair similar to other men in your family-just remember, though, that everyone is different and it can take 5 or more years to see the final results.  Regarding the hair on your head: most trans men notice some degree of frontal scalp hair thinning, especially in the area of your temples. Depending on your age and family history, you may develop thinning hair, female pattern baldness or even complete hair loss. Approaches to managing hair loss in trans men is the same as with cisgender men; treatments can include the partial testosterone blocker finasteride, minoxidil, which is also known as Rogaine, applied to the scalp, and hair transplantation. As with cis men, unfortunately there is no way to completely prevent  female pattern baldness in those predisposed to develop this condition. Ask your provider for more information on strategies for managing hair loss.  Regarding facial hair, beards vary from person to person. Some people develop a thick beard quite rapidly, others take several years, while some never develop a full, thick beard. Just as with cisgender men, trans men may have varying degrees of facial hair thickness and develop it at varying ages. Those who start testosterone later in life may experience less overall facial hair development than those who start at younger ages.  Lastly, you may notice changes in your perception of the senses. For example, when you touch things, they may "feel different" and you may perceive pain and temperature differently. Your tastes in foods or scents may change.  Emotional state changes  Puberty is a roller coaster of emotions and the second puberty that you will experience during your transition is no exception. You may find that you have access to a narrower range of emotions or feelings, or have different interests, tastes or pastimes, or behave differently in relationships with other people. For most people, things usually settle down after a period time. Some people experience little  or no change in their emotional state. I encourage you to take the time to learn new things about yourself, and sit with new or unfamiliar feelings and emotions while you explore and familiarize yourself with them. While psychotherapy is not for everyone, many people find that working with a therapist while in transition can help you to explore these new thoughts and feelings, get to know your new body and self, and help you with things like coming out to family, friends, or coworkers, and developing a greater level of self-love and acceptance.  Reproductive system changes  You may notice at first that your periods become lighter, arrive later, or are shorter in duration, though some  may notice heavier or longer lasting periods for a few cycles before they stop altogether.  Testosterone may reduce your ability to become pregnant but it does not completely eliminate the risk of pregnancy. Transgender men can become pregnant while on testosterone, so if you remain sexually active with someone who is capable of producing sperm, you should always use a method of birth control to prevent unwanted pregnancy. Transgender men may use any form of contraception, including the numerous options available that do not contain estrogen, and some that contain no hormones at all. There are many contraception options that are long acting and do not require taking a daily pill. Transgender men may also use emergency contraception, also known as the "morning after pill". Ask your medical provider for more information on the contraceptive and family planning options available to you.  If you suspect you may have become pregnant or have a positive pregnancy test while taking testosterone, speak with you provider as soon as possible, as testosterone can endanger the fetus.  If you do want to have a pregnancy, you'll have to stop testosterone treatment and wait until your provider tells you that it's okay to begin trying to conceive.  It's also important to know that, depending on how long you've been on testosterone therapy, it may become difficult for your ovaries to release eggs, and you may need to consult with a fertility specialist and use special medications or techniques, such as in vitro fertilization, to become pregnant. These treatments are not always covered by insurance, and can be expensive. Uncommonly, testosterone therapy may cause you to completely lose the ability to create fertile eggs or become pregnant.  Risks  While cisgender men do have higher rates of cholesterol related disorders and heart disease than cisgender women, the available research on transgender men taking testosterone  has generally not found these differences. Most of the research on risk of heart disease and strokes in transgender men suggests that risk does not increase once testosterone is begun. However, longer term, definitive studies are lacking. It has been suggested that the risk of other conditions such as diabetes or being overweight is increased by masculinizing testosterone therapy, however actual research supporting these claims are limited.  One known risk is that testosterone can make your blood become too thick, otherwise known as a high hematocrit count, which can cause a stroke, heart attack or other conditions. This can be a particular problem if you are taking a dose that is too high for your body's metabolism. This can be prevented by maintaining an appropriate dose and through blood tests to monitor blood and hormone levels.  While available data are limited, it does not appear that testosterone increases the risk of cancer to the uterus, ovaries, or breasts. Because not all breast tissue is removed  during masculinizing chest surgery, otherwise known as top surgery, there is a theoretical risk that breast cancer could develop in the remaining tissue. However, it can be difficult to screen for breast cancer in this tissue, and there are risks of a false positive test result. Your provider can give you more information about breast cancer screening after top surgery.  Cervical cancer is caused by an infection with the human papillomavirus, or HPV. HPV is transmitted sexually, more commonly by having sexual contact with someone who has a penis. However, people who have never had sexual contact with a penis may still contract an HPV infection. The HPV vaccine can greatly reduce your risk of cervical cancer, and you may want to discuss this with your provider. Pap smears are used to detect cervical cancer or precancer conditions, as well as an HPV infection. Your provider will make a recommendation as to  how often you should have a pap smear. It is unclear if testosterone therapy plays any role in HPV infection or cervical cancer.  If your periods have stopped because of testosterone treatment, be sure to report any return of bleeding or spotting to your provider, who may request an ultrasound or other tests to be certain the bleeding isn't a symptom of an imbalance of the lining of the uterus. Sometimes such an imbalance could lead to a precancerous condition, although this is rare in transgender men. Missing a dose or changing your dose can sometimes result in return of bleeding or spotting. Some men may experience a return of spotting or heavier bleeding after months or even years of testosterone treatment. In most cases this represents changes in the body's metabolism over time. To be safe, always discuss any new or changes to bleeding patterns with your doctor.  Fortunately, since you do not have a prostate, you have no risk of prostate cancer and there is no need to screen for this condition.  If you have had your ovaries removed, it is important to remain on at least a low dose of hormones post-op until at minimum age 2. This will help prevent a weakening of the bones, otherwise known as osteoporosis, , which can result in serious and disabling bone fractures.  Most people using masculinizing testosterone therapy will experience at least a small amount of acne. Some may experience more advanced acne. Often this acne responds to typical over-the-counter treatments, but in some cases prescription medication may be required. Acne usually peaks within the first year of treatment and then begins to improve.  While gender affirming hormone therapy usually results in an improvement in mood, some people may experience mood swings or a worsening of anxiety, depression, or other mental health conditions as a result of the shifts associated with starting a second puberty. If you have any mental health  conditions it is recommended you remain in discussion with a mental health providers as you begin hormone therapy.  Other medical conditions may be impacted by gender affirming hormone therapy, though research is lacking. These include autoimmune conditions, which can sometimes improve or worsen with hormone shifts, and migraines, which often have a hormonal component. Ask your medical provider if you have further questions about the risks, health monitoring needs, and other long term considerations when taking hormone therapy.  Some of the effects of hormone therapy are reversible, if you stop taking them. The degree to which they can be reversed depends on how long you have been taking testosterone. Clitoral growth, facial hair growth, voice changes  and female-pattern baldness are not reversible.  Testosterone treatment approaches  Testosterone comes in several forms. Injections are usually best given weekly to maintain even levels of testosterone in the blood. Studies have shown that using a smaller needle and injection by the subcutaneous, or under the skin, approach, is just as effective as the intramuscular approach, which involves a larger needle injecting deeper into the muscle. In addition to injections, there are gel and patches that can be applied to the skin daily. The gel is applied to skin and once dry, you can swim, shower, and have contact with others. The patch also allows swimming, showering, exercise, and contact with others. All of these forms work equally well when the dosing is adjusted to achieve the desired hormone levels, and the decision about which form to use should be based mostly on your preference.  Another option for testosterone is the use of pellets under the skin. These are inserted every few months via a minor in-office procedure. Ask your medical provider for more information about this approach.  Recently, an oral form of testosterone, taken as a pill twice daily, has  been approved for use. There are potential risks of high blood pressure when taking this medication, so extra steps need to be taken to monitor your health if you choose to use this form of your testosterone. Ask your medical provider for more information about this approach.  Regardless of the type of testosterone you are taking, it's important to know that taking more testosterone will not make your changes progress more quickly, but could cause serious side effects or complications. Excess testosterone can result in mood symptoms or irritability, bloating, pelvic cramping, or even a return of menstruation. High levels of testosterone also result in increased estrogen levels, as a percentage of all testosterone in the body is converted to estrogen. In general estrogen blocking medicines are not used as a part of masculinizing hormone therapy.  Other medications that may be used include progestagens, which are hormones similar to or identical to those made by the body to maintain a balance in the lining of the uterus. These hormones can be used in cases where periods continue after testosterone levels have been optimized. These hormones can cause mood swings, bloating, and other side effects, so it is recommended that you discuss these medications further with you provider if they are to be used.

## 2020-10-20 LAB — TESTOSTERONE: Testosterone: 454 ng/dL

## 2020-10-20 LAB — HEMOGLOBIN AND HEMATOCRIT, BLOOD
HCT: 46.6 % — ABNORMAL HIGH (ref 35.0–45.0)
Hemoglobin: 15.4 g/dL (ref 11.7–15.5)

## 2020-10-22 ENCOUNTER — Other Ambulatory Visit: Payer: Self-pay | Admitting: Pediatrics

## 2020-10-22 DIAGNOSIS — E349 Endocrine disorder, unspecified: Secondary | ICD-10-CM

## 2020-10-22 MED ORDER — TESTOSTERONE CYPIONATE 200 MG/ML IM SOLN: INTRAMUSCULAR | 0 refills | Status: DC

## 2020-10-29 NOTE — Progress Notes (Signed)
I have reviewed the resident's note and plan of care and helped develop the plan as necessary.  Ja Pistole, FNP   

## 2020-12-13 ENCOUNTER — Telehealth: Payer: Medicaid Other | Admitting: Pediatrics

## 2021-04-18 ENCOUNTER — Ambulatory Visit (HOSPITAL_COMMUNITY)
Admission: EM | Admit: 2021-04-18 | Discharge: 2021-04-18 | Disposition: A | Discharge status: Home / Self Care | Payer: Medicaid Other | Attending: Emergency Medicine | Admitting: Emergency Medicine

## 2021-04-18 ENCOUNTER — Other Ambulatory Visit: Payer: Self-pay

## 2021-04-18 ENCOUNTER — Encounter (HOSPITAL_COMMUNITY): Payer: Self-pay

## 2021-04-18 DIAGNOSIS — H6501 Acute serous otitis media, right ear: Secondary | ICD-10-CM | POA: Diagnosis not present

## 2021-04-18 DIAGNOSIS — Z79890 Hormone replacement therapy (postmenopausal): Secondary | ICD-10-CM | POA: Insufficient documentation

## 2021-04-18 DIAGNOSIS — B349 Viral infection, unspecified: Secondary | ICD-10-CM

## 2021-04-18 DIAGNOSIS — Z20822 Contact with and (suspected) exposure to covid-19: Secondary | ICD-10-CM | POA: Insufficient documentation

## 2021-04-18 DIAGNOSIS — J029 Acute pharyngitis, unspecified: Secondary | ICD-10-CM | POA: Diagnosis present

## 2021-04-18 DIAGNOSIS — Z793 Long term (current) use of hormonal contraceptives: Secondary | ICD-10-CM | POA: Insufficient documentation

## 2021-04-18 DIAGNOSIS — H9201 Otalgia, right ear: Secondary | ICD-10-CM | POA: Diagnosis present

## 2021-04-18 LAB — POC INFLUENZA A AND B ANTIGEN (URGENT CARE ONLY)
INFLUENZA A ANTIGEN, POC: NEGATIVE
INFLUENZA B ANTIGEN, POC: NEGATIVE

## 2021-04-18 LAB — SARS CORONAVIRUS 2 (TAT 6-24 HRS): SARS Coronavirus 2: NEGATIVE

## 2021-04-18 MED ORDER — AMOXICILLIN-POT CLAVULANATE 875-125 MG PO TABS: 1.0000 | ORAL_TABLET | Freq: Two times a day (BID) | ORAL | 0 refills | Status: DC

## 2021-04-18 NOTE — ED Triage Notes (Signed)
Pt presents with ear pain and sore throat X  4 days.  States she had a fever on Tuesday night and states she think she has an ear infection.   States she wants to get a mole checked out.

## 2021-04-18 NOTE — Discharge Instructions (Signed)
Take antibiotic twice a day for the next 7 days, may follow up if pain persist after taking medication   Covid and flu test pending, you will be called if positive, follow CDC guidelines if positive for COVID for quarantine recommendations   Can continue use of cold/ flu medication  Salt water gargles, hot liquids and throat lozenges to help sore throat  Mucinex and robitussin can help with congestion

## 2021-04-18 NOTE — ED Provider Notes (Signed)
[facility]    CSN: [id number] Arrival date & time: 04/18/21  0856      History   Chief Complaint Chief Complaint  Patient presents with   Sore Throat   Ear Pain    HPI Laurie [name] is a 20 y.o. adult.   Patient presents with right ear pain, ear itching, congestion, post nasal drip, sore throat, productive cough, diarrhea which has resolved and intermittent low grade fever for 4 days. Tolerating food and liquids. Denies headache, ear discharge, decreased hearing, chest pain or tightness, rhinorrhea. No known sick contacts. Has been taking cold and flu medications with minimal relief. Vaccinated.    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/28/2018   Prediabetes 02/08/2016   Vitamin D deficiency 12/21/2014    Patient Active Problem List   Diagnosis Date Noted   Endocrine disorder, unspecified 11/21/2019   Menstrual cramps 02/08/2016   Frequent headaches 12/21/2014   BMI (body mass index), pediatric, greater than or equal to 95% for age 91/07/2015    History reviewed. No pertinent surgical history.  OB History   No obstetric history on file.      Home Medications    Prior to Admission medications   Medication Sig Start Date End Date Taking? Authorizing Provider  amoxicillin-clavulanate (AUGMENTIN) 875-125 MG tablet Take 1 tablet by mouth every 12 (twelve) hours. 04/18/21  Yes Mantaj Chamberlin, Elita Boone, NP  medroxyPROGESTERone (DEPO-PROVERA) 150 MG/ML injection Inject 1 mL (150 mg total) into the muscle every 3 (three) months. 08/15/20   Verneda Skill, FNP  NEEDLE, DISP, 25 G 25G X 5/8" MISC Use 1 needle weekly for subcutaneous testosterone injection 05/01/20   Alfonso Ramus T, FNP  Syringe/Needle, Disp, (B-D ECLIPSE SYRINGE) 27G X 1/2" 1 ML MISC Use 1 syringe and needle weekly for testosterone injection 10/08/20   Georges Mouse, NP  testosterone cypionate (DEPOTESTOSTERONE CYPIONATE) 200 MG/ML injection Inject 75 mg (0.375 mL)  subcutaneously once weekly. 10/22/20   Verneda Skill, FNP    Family History Family History  Problem Relation Age of Onset   Cancer Maternal Grandmother    Alcohol abuse Maternal Grandmother    Diabetes Maternal Grandfather    Heart disease Maternal Grandfather    Hyperlipidemia Maternal Grandfather    Hypertension Maternal Grandfather    Alcohol abuse Maternal Grandfather    Obesity Paternal Grandmother    Hyperlipidemia Paternal Grandmother    Hypertension Paternal Grandmother    Obesity Paternal Grandfather    Heart disease Paternal Grandfather        A fib related to history of rheumatic heart disease   Diabetes Paternal Grandfather    Asthma Neg Hx     Social History Social History   Tobacco Use   Smoking status: Never   Smokeless tobacco: Never  Substance Use Topics   Alcohol use: Never   Drug use: Never     Allergies   Patient has no known allergies.   Review of Systems Review of Systems Defer to HPI    Physical Exam Triage Vital Signs ED Triage Vitals  Enc Vitals Group     BP 04/18/21 0944 130/85     Pulse Rate 04/18/21 0944 93     Resp 04/18/21 0944 18     Temp 04/18/21 0944 99.3 F (37.4 C)     Temp Source 04/18/21 0944 Oral     SpO2 04/18/21 0944 100 %     Weight --  Height --      Head Circumference --      Peak Flow --      Pain Score 04/18/21 0942 4     Pain Loc --      Pain Edu? --      Excl. in GC? --    No data found.  Updated Vital Signs BP 130/85 (BP Location: Right Arm)   Pulse 93   Temp 99.3 F (37.4 C) (Oral)   Resp 18   LMP 04/01/2021 (Exact Date)   SpO2 100%   Visual Acuity Right Eye Distance:   Left Eye Distance:   Bilateral Distance:    Right Eye Near:   Left Eye Near:    Bilateral Near:     Physical Exam Constitutional:      Appearance: He is well-developed and normal weight.  HENT:     Head: Normocephalic.     Right Ear: Ear canal normal. No drainage or tenderness. No middle ear effusion.  Tympanic membrane is erythematous.     Left Ear: Tympanic membrane and ear canal normal. Tympanic membrane is not erythematous.     Nose: Congestion present. No rhinorrhea.     Mouth/Throat:     Mouth: Mucous membranes are moist.     Pharynx: Posterior oropharyngeal erythema present. No pharyngeal swelling or oropharyngeal exudate.     Tonsils: No tonsillar exudate or tonsillar abscesses. 2+ on the right. 1+ on the left.  Eyes:     Conjunctiva/sclera: Conjunctivae normal.     Pupils: Pupils are equal, round, and reactive to light.  Cardiovascular:     Rate and Rhythm: Normal rate and regular rhythm.     Heart sounds: Normal heart sounds.  Pulmonary:     Effort: Pulmonary effort is normal.     Breath sounds: Normal breath sounds.  Musculoskeletal:     Cervical back: Normal range of motion and neck supple.  Skin:    General: Skin is warm and dry.  Neurological:     Mental Status: He is alert and oriented to person, place, and time.  Psychiatric:        Mood and Affect: Mood normal.        Behavior: Behavior normal.     UC Treatments / Results  Labs (all labs ordered are listed, but only abnormal results are displayed) Labs Reviewed  SARS CORONAVIRUS 2 (TAT 6-24 HRS)  POC INFLUENZA A AND B ANTIGEN (URGENT CARE ONLY)    EKG   Radiology No results found.  Procedures Procedures (including critical care time)  Medications Ordered in UC Medications - No data to display  Initial Impression / Assessment and Plan / UC Course  I have reviewed the triage vital signs and the nursing notes.  Pertinent labs & imaging results that were available during my care of the patient were reviewed by me and considered in my medical decision making (see chart for details).  Acute serous otitis media of right ear Viral Illness  Augmentin 875/125 bid for 7 days Continue otc medications and home remedies for symptom management  Covid test pending Flu test pending Final Clinical  Impressions(s) / UC Diagnoses   Final diagnoses:  Non-recurrent acute serous otitis media of right ear  Viral illness     Discharge Instructions      Take antibiotic twice a day for the next 7 days, may follow up if pain persist after taking medication   Covid and flu test pending, you will be called if  positive, follow CDC guidelines if positive for COVID for quarantine recommendations   Can continue use of cold/ flu medication  Salt water gargles, hot liquids and throat lozenges to help sore throat  Mucinex and robitussin can help with congestion    ED Prescriptions     Medication Sig Dispense Auth. Provider   amoxicillin-clavulanate (AUGMENTIN) 875-125 MG tablet Take 1 tablet by mouth every 12 (twelve) hours. 14 tablet Paxson Harrower, Elita Boone, NP      PDMP not reviewed this encounter.   Valinda Hoar, NP 04/18/21 1056

## 2021-05-01 ENCOUNTER — Other Ambulatory Visit: Payer: Self-pay

## 2021-05-01 ENCOUNTER — Ambulatory Visit
Admission: EM | Admit: 2021-05-01 | Discharge: 2021-05-01 | Disposition: A | Discharge status: Home / Self Care | Payer: Medicaid Other | Attending: Family Medicine | Admitting: Family Medicine

## 2021-05-01 DIAGNOSIS — R5383 Other fatigue: Secondary | ICD-10-CM | POA: Diagnosis not present

## 2021-05-01 DIAGNOSIS — J039 Acute tonsillitis, unspecified: Secondary | ICD-10-CM | POA: Insufficient documentation

## 2021-05-01 DIAGNOSIS — R509 Fever, unspecified: Secondary | ICD-10-CM | POA: Insufficient documentation

## 2021-05-01 LAB — POCT RAPID STREP A (OFFICE): Rapid Strep A Screen: NEGATIVE

## 2021-05-01 LAB — POCT MONO SCREEN (KUC): Mono, POC: NEGATIVE

## 2021-05-01 MED ORDER — DEXAMETHASONE SODIUM PHOSPHATE 10 MG/ML IJ SOLN
10.0000 mg | Freq: Once | INTRAMUSCULAR | Status: AC
  Administered 2021-05-01: 10 mg via INTRAMUSCULAR

## 2021-05-01 MED ORDER — LIDOCAINE VISCOUS HCL 2 % MT SOLN: 10.0000 mL | OROMUCOSAL | 0 refills | Status: DC | PRN

## 2021-05-01 NOTE — ED Triage Notes (Signed)
Pt present body aches, chills and congestion with sore throat. Symptom started two weeks ago. Pt states  tried otc medication with no relief.

## 2021-05-01 NOTE — ED Provider Notes (Signed)
[facility]    CSN: [id number] Arrival date & time: 05/01/21  1227      History   Chief Complaint Chief Complaint  Patient presents with   Cough   Sore Throat   Generalized Body Aches    HPI Laurie [name] is a 20 y.o. adult.   Patient presenting today with 2-week history of ongoing body aches, chills, fatigue, sore, swollen throat.  Also initially had a lot of cough and congestion but that has let up some per patient.  was seen 2 weeks ago for the symptoms, treated with Augmentin for a serous otitis media and suspected viral infection which patient states did not improve symptoms whatsoever.  Has been using over-the-counter remedies such as TheraFlu with minimal relief additionally.  Tested negative already for COVID and flu.  No known sick contacts though grandfather who patient lives with is currently hospitalized with pneumonia.  No known chronic pertinent medical problems.    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/28/2018   Prediabetes 02/08/2016   Vitamin D deficiency 12/21/2014    Patient Active Problem List   Diagnosis Date Noted   Endocrine disorder, unspecified 11/21/2019   Menstrual cramps 02/08/2016   Frequent headaches 12/21/2014   BMI (body mass index), pediatric, greater than or equal to 95% for age 107/07/2015    History reviewed. No pertinent surgical history.  OB History   No obstetric history on file.    Home Medications    Prior to Admission medications   Medication Sig Start Date End Date Taking? Authorizing Provider  lidocaine (XYLOCAINE) 2 % solution Use as directed 10 mLs in the mouth or throat as needed for mouth pain. 05/01/21  Yes Particia Nearing, PA-C  amoxicillin-clavulanate (AUGMENTIN) 875-125 MG tablet Take 1 tablet by mouth every 12 (twelve) hours. 04/18/21   Valinda Hoar, NP  medroxyPROGESTERone (DEPO-PROVERA) 150 MG/ML injection Inject 1 mL (150 mg total) into the muscle every 3  (three) months. 08/15/20   Verneda Skill, FNP  NEEDLE, DISP, 25 G 25G X 5/8" MISC Use 1 needle weekly for subcutaneous testosterone injection 05/01/20   Alfonso Ramus T, FNP  Syringe/Needle, Disp, (B-D ECLIPSE SYRINGE) 27G X 1/2" 1 ML MISC Use 1 syringe and needle weekly for testosterone injection 10/08/20   Georges Mouse, NP  testosterone cypionate (DEPOTESTOSTERONE CYPIONATE) 200 MG/ML injection Inject 75 mg (0.375 mL) subcutaneously once weekly. 10/22/20   Verneda Skill, FNP    Family History Family History  Problem Relation Age of Onset   Cancer Maternal Grandmother    Alcohol abuse Maternal Grandmother    Diabetes Maternal Grandfather    Heart disease Maternal Grandfather    Hyperlipidemia Maternal Grandfather    Hypertension Maternal Grandfather    Alcohol abuse Maternal Grandfather    Obesity Paternal Grandmother    Hyperlipidemia Paternal Grandmother    Hypertension Paternal Grandmother    Obesity Paternal Grandfather    Heart disease Paternal Grandfather        A fib related to history of rheumatic heart disease   Diabetes Paternal Grandfather    Asthma Neg Hx     Social History Social History   Tobacco Use   Smoking status: Never   Smokeless tobacco: Never  Substance Use Topics   Alcohol use: Never   Drug use: Never     Allergies   Patient has no known allergies.   Review of Systems Review of Systems Per HPI  Physical Exam Triage Vital Signs ED Triage Vitals [05/01/21 1410]  Enc Vitals Group     BP 129/76     Pulse Rate (!) 110     Resp 16     Temp 99.1 F (37.3 C)     Temp Source Oral     SpO2 98 %     Weight      Height      Head Circumference      Peak Flow      Pain Score      Pain Loc      Pain Edu?      Excl. in GC?    No data found.  Updated Vital Signs BP 129/76 (BP Location: Left Arm)   Pulse (!) 108   Temp 99.1 F (37.3 C) (Oral)   Resp 16   LMP 04/01/2021 (Exact Date)   SpO2 98%   Visual Acuity Right Eye  Distance:   Left Eye Distance:   Bilateral Distance:    Right Eye Near:   Left Eye Near:    Bilateral Near:     Physical Exam Vitals and nursing note reviewed.  Constitutional:      Appearance: Normal appearance.  HENT:     Head: Atraumatic.     Right Ear: Tympanic membrane normal.     Left Ear: Tympanic membrane normal.     Nose: Rhinorrhea present.     Mouth/Throat:     Mouth: Mucous membranes are moist.     Pharynx: Oropharyngeal exudate present.     Comments: Significant bilateral tonsillar edema, mild erythema, exudates.  No abscess Eyes:     Extraocular Movements: Extraocular movements intact.     Conjunctiva/sclera: Conjunctivae normal.  Cardiovascular:     Rate and Rhythm: Normal rate and regular rhythm.     Heart sounds: Normal heart sounds.  Pulmonary:     Effort: Pulmonary effort is normal.     Breath sounds: Normal breath sounds. No wheezing or rales.  Abdominal:     General: Bowel sounds are normal. There is no distension.     Palpations: Abdomen is soft.     Tenderness: There is no abdominal tenderness. There is no guarding.  Musculoskeletal:        General: Normal range of motion.     Cervical back: Normal range of motion and neck supple.  Skin:    General: Skin is warm and dry.  Neurological:     General: No focal deficit present.     Mental Status: He is oriented to person, place, and time.  Psychiatric:        Mood and Affect: Mood normal.        Thought Content: Thought content normal.        Judgment: Judgment normal.     UC Treatments / Results  Labs (all labs ordered are listed, but only abnormal results are displayed) Labs Reviewed  CULTURE, GROUP A STREP [facility])  POCT RAPID STREP A (OFFICE)  POCT MONO SCREEN ([facility])    EKG   Radiology No results found.  Procedures Procedures (including critical care time)  Medications Ordered in UC Medications  dexamethasone (DECADRON) injection 10 mg (10 mg Intramuscular Given 05/01/21 1505)     Initial Impression / Assessment and Plan / UC Course  I have reviewed the triage vital signs and the nursing notes.  Pertinent labs & imaging results that were available during my care of the patient were reviewed by me and considered in  my medical decision making (see chart for details).     Overall well-appearing, mildly tachycardic but otherwise vital signs reassuring.  Has already tested negative for COVID and flu, strep also negative today as well as mono testing.  Throat culture pending.  Do favor mono diagnosis though Monospot negative given duration and quality of symptoms.  Discussed supportive over-the-counter care and IM Decadron in clinic for inflammation.  Viscous lidocaine sent to pharmacy for relief of sore throat.  Return for acutely worsening symptoms.  Final Clinical Impressions(s) / UC Diagnoses   Final diagnoses:  Fever, unspecified  Acute tonsillitis, unspecified etiology  Fatigue, unspecified type   Discharge Instructions   None    ED Prescriptions     Medication Sig Dispense Auth. Provider   lidocaine (XYLOCAINE) 2 % solution Use as directed 10 mLs in the mouth or throat as needed for mouth pain. 100 mL Particia Nearing, [place]      PDMP not reviewed this encounter.   Particia Nearing, [place] 05/01/21 1906

## 2021-05-04 LAB — CULTURE, GROUP A STREP (THRC)

## 2021-07-12 ENCOUNTER — Emergency Department (HOSPITAL_COMMUNITY)
Admission: EM | Admit: 2021-07-12 | Discharge: 2021-07-12 | Disposition: A | Discharge status: Home / Self Care | Payer: Medicaid Other | Attending: Emergency Medicine | Admitting: Emergency Medicine

## 2021-07-12 ENCOUNTER — Emergency Department (HOSPITAL_COMMUNITY): Payer: Medicaid Other

## 2021-07-12 DIAGNOSIS — S9031XA Contusion of right foot, initial encounter: Secondary | ICD-10-CM | POA: Insufficient documentation

## 2021-07-12 DIAGNOSIS — T1490XA Injury, unspecified, initial encounter: Secondary | ICD-10-CM

## 2021-07-12 DIAGNOSIS — S99921A Unspecified injury of right foot, initial encounter: Secondary | ICD-10-CM | POA: Diagnosis not present

## 2021-07-12 DIAGNOSIS — W208XXA Other cause of strike by thrown, projected or falling object, initial encounter: Secondary | ICD-10-CM | POA: Diagnosis not present
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
## 2021-07-12 NOTE — Discharge Instructions (Signed)
Use supportive shoe until a traditional shoe is comfortable. Follow up with podiatry if not improving in 1 week. At home, elevate, apply ice for 20 minutes at a time.
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
## 2021-07-21 DIAGNOSIS — R509 Fever, unspecified: Secondary | ICD-10-CM | POA: Diagnosis not present

## 2021-07-21 DIAGNOSIS — Z20822 Contact with and (suspected) exposure to covid-19: Secondary | ICD-10-CM | POA: Diagnosis not present

## 2021-07-21 DIAGNOSIS — B370 Candidal stomatitis: Secondary | ICD-10-CM | POA: Diagnosis not present

## 2021-07-21 DIAGNOSIS — J029 Acute pharyngitis, unspecified: Secondary | ICD-10-CM | POA: Diagnosis not present

## 2021-07-22 ENCOUNTER — Encounter (HOSPITAL_COMMUNITY): Payer: Self-pay | Admitting: *Deleted

## 2021-07-22 ENCOUNTER — Other Ambulatory Visit: Payer: Self-pay

## 2021-07-22 ENCOUNTER — Emergency Department (HOSPITAL_COMMUNITY)
Admission: EM | Admit: 2021-07-22 | Discharge: 2021-07-22 | Disposition: A | Discharge status: Home / Self Care | Payer: Medicaid Other | Attending: Emergency Medicine | Admitting: Emergency Medicine

## 2021-07-22 DIAGNOSIS — D72829 Elevated white blood cell count, unspecified: Secondary | ICD-10-CM | POA: Insufficient documentation

## 2021-07-22 DIAGNOSIS — Z20822 Contact with and (suspected) exposure to covid-19: Secondary | ICD-10-CM | POA: Insufficient documentation

## 2021-07-22 DIAGNOSIS — R1084 Generalized abdominal pain: Secondary | ICD-10-CM | POA: Insufficient documentation

## 2021-07-22 DIAGNOSIS — J039 Acute tonsillitis, unspecified: Secondary | ICD-10-CM

## 2021-07-22 DIAGNOSIS — R112 Nausea with vomiting, unspecified: Secondary | ICD-10-CM | POA: Diagnosis not present

## 2021-07-22 DIAGNOSIS — R000 Tachycardia, unspecified: Secondary | ICD-10-CM | POA: Diagnosis not present

## 2021-07-22 DIAGNOSIS — R509 Fever, unspecified: Secondary | ICD-10-CM | POA: Diagnosis present

## 2021-07-22 LAB — CBC WITH DIFFERENTIAL/PLATELET
Abs Immature Granulocytes: 0.03 10*3/uL (ref 0.00–0.07)
Basophils Absolute: 0 10*3/uL (ref 0.0–0.1)
Basophils Relative: 0 %
Eosinophils Absolute: 0 10*3/uL (ref 0.0–0.5)
Eosinophils Relative: 0 %
HCT: 44.5 % (ref 36.0–46.0)
Hemoglobin: 15.3 g/dL — ABNORMAL HIGH (ref 12.0–15.0)
Immature Granulocytes: 0 %
Lymphocytes Relative: 12 %
Lymphs Abs: 1.8 10*3/uL (ref 0.7–4.0)
MCH: 30.4 pg (ref 26.0–34.0)
MCHC: 34.4 g/dL (ref 30.0–36.0)
MCV: 88.5 fL (ref 80.0–100.0)
Monocytes Absolute: 1.1 10*3/uL — ABNORMAL HIGH (ref 0.1–1.0)
Monocytes Relative: 7 %
Neutro Abs: 11.2 10*3/uL — ABNORMAL HIGH (ref 1.7–7.7)
Neutrophils Relative %: 81 %
Platelets: 251 10*3/uL (ref 150–400)
RBC: 5.03 MIL/uL (ref 3.87–5.11)
RDW: 12.8 % (ref 11.5–15.5)
WBC: 14.1 10*3/uL — ABNORMAL HIGH (ref 4.0–10.5)
nRBC: 0 % (ref 0.0–0.2)

## 2021-07-22 LAB — COMPREHENSIVE METABOLIC PANEL
ALT: 19 U/L (ref 0–44)
AST: 23 U/L (ref 15–41)
Albumin: 4.4 g/dL (ref 3.5–5.0)
Alkaline Phosphatase: 72 U/L (ref 38–126)
Anion gap: 13 (ref 5–15)
BUN: 5 mg/dL — ABNORMAL LOW (ref 6–20)
CO2: 21 mmol/L — ABNORMAL LOW (ref 22–32)
Calcium: 10 mg/dL (ref 8.9–10.3)
Chloride: 106 mmol/L (ref 98–111)
Creatinine, Ser: 0.67 mg/dL (ref 0.44–1.00)
GFR, Estimated: >60 mL/min (ref >60)
Glucose, Bld: 91 mg/dL (ref 70–99)
Potassium: 4.5 mmol/L (ref 3.5–5.1)
Sodium: 140 mmol/L (ref 135–145)
Total Bilirubin: 0.9 mg/dL (ref 0.3–1.2)
Total Protein: 8.3 g/dL — ABNORMAL HIGH (ref 6.5–8.1)

## 2021-07-22 LAB — PREGNANCY, URINE: Preg Test, Ur: NEGATIVE

## 2021-07-22 LAB — LIPASE, BLOOD: Lipase: 24 U/L (ref 11–51)

## 2021-07-22 LAB — RESP PANEL BY RT-PCR (FLU A&B, COVID) ARPGX2
Influenza A by PCR: NEGATIVE
Influenza B by PCR: NEGATIVE
SARS Coronavirus 2 by RT PCR: NEGATIVE

## 2021-07-22 MED ORDER — ONDANSETRON HCL 4 MG/2ML IJ SOLN
4.0000 mg | Freq: Once | INTRAMUSCULAR | Status: AC
  Administered 2021-07-22: 4 mg via INTRAVENOUS
  Filled 2021-07-22: qty 2

## 2021-07-22 MED ORDER — SODIUM CHLORIDE 0.9 % IV BOLUS
1000.0000 mL | Freq: Once | INTRAVENOUS | Status: AC
  Administered 2021-07-22: 1000 mL via INTRAVENOUS

## 2021-07-22 MED ORDER — ONDANSETRON 4 MG PO TBDP: ORAL_TABLET | ORAL | 0 refills | Status: DC

## 2021-07-22 MED ORDER — ONDANSETRON 4 MG PO TBDP
4.0000 mg | ORAL_TABLET | Freq: Once | ORAL | Status: DC
  Filled 2021-07-22: qty 1

## 2021-07-22 MED ORDER — ACETAMINOPHEN 500 MG PO TABS
1000.0000 mg | ORAL_TABLET | Freq: Once | ORAL | Status: AC | PRN
  Administered 2021-07-22: 1000 mg via ORAL
  Filled 2021-07-22: qty 2

## 2021-07-22 MED ORDER — ACETAMINOPHEN 325 MG PO TABS
650.0000 mg | ORAL_TABLET | Freq: Once | ORAL | Status: DC | PRN
  Filled 2021-07-22: qty 2

## 2021-07-22 NOTE — ED Notes (Signed)
Patient discharge instructions reviewed with the patient. The patient verbalized understanding of instructions. Patient discharged. 

## 2021-07-22 NOTE — ED Notes (Signed)
IV attempted x2

## 2021-07-22 NOTE — ED Triage Notes (Signed)
Pt went to [facility]. Reports ongoing fever, sore throat, dizziness and now n/v today. Had similar episode in recent months, lasted 3 weeks and she was tested for mono, strep etc and all negative. Strep neg yesterday at [facility], treated with amoxicillin. HR 140 at triage, febrile. No resp distress is noted.

## 2021-07-22 NOTE — ED Provider Notes (Signed)
[name] EMERGENCY DEPARTMENT Provider Note   CSN: [id number] Arrival date & time: 07/22/21  1258     History Chief Complaint  Patient presents with   Fever   Sore Throat    Laurie [name] is a 20 y.o. adult.  Laurie [name] is a 20 y.o. transgender female, who is otherwise healthy, presenting with fever and sore throat. Symptoms started 3 days ago, and patient visited urgent care yesterday. At urgent care, rapid strep and COVID were negative, and declined culture. Treated with Augmentin, though is here today because patient developed vomiting this morning and unable to take medicine by mouth.  Reports persistent sore throat that is described as a constant dull ache, worse with swallowing.  But patient is able to swallow without difficulty, no change in voice.  No associated neck pain or swelling.  Reports associated slight headache, chills, and slight abdominal discomfort. Denies urinary symptoms. No known sick contacts. Patient had a similar illness 2 months ago, and symptoms resolved with time.   The history is provided by the patient and medical records.      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/28/2018   Prediabetes 02/08/2016   Vitamin D deficiency 12/21/2014    Patient Active Problem List   Diagnosis Date Noted   Endocrine disorder, unspecified 11/21/2019   Menstrual cramps 02/08/2016   Frequent headaches 12/21/2014   BMI (body mass index), pediatric, greater than or equal to 95% for age 45/07/2015    History reviewed. No pertinent surgical history.   OB History   No obstetric history on file.     Family History  Problem Relation Age of Onset   Cancer Maternal Grandmother    Alcohol abuse Maternal Grandmother    Diabetes Maternal Grandfather    Heart disease Maternal Grandfather    Hyperlipidemia Maternal Grandfather    Hypertension Maternal Grandfather    Alcohol abuse Maternal Grandfather    Obesity  Paternal Grandmother    Hyperlipidemia Paternal Grandmother    Hypertension Paternal Grandmother    Obesity Paternal Grandfather    Heart disease Paternal Grandfather        A fib related to history of rheumatic heart disease   Diabetes Paternal Grandfather    Asthma Neg Hx     Social History   Tobacco Use   Smoking status: Never   Smokeless tobacco: Never  Substance Use Topics   Alcohol use: Never   Drug use: Never    Home Medications Prior to Admission medications   Medication Sig Start Date End Date Taking? Authorizing Provider  ondansetron (ZOFRAN ODT) 4 MG disintegrating tablet 4mg  ODT q4 hours prn nausea/vomit 07/22/21  Yes Nya Monds N, PA-C  amoxicillin-clavulanate (AUGMENTIN) 875-125 MG tablet Take 1 tablet by mouth every 12 (twelve) hours. 04/18/21   White, 06/19/21, NP  lidocaine (XYLOCAINE) 2 % solution Use as directed 10 mLs in the mouth or throat as needed for mouth pain. 05/01/21   05/03/21, PA-C  medroxyPROGESTERone (DEPO-PROVERA) 150 MG/ML injection Inject 1 mL (150 mg total) into the muscle every 3 (three) months. 08/15/20   13/3/21, FNP  NEEDLE, DISP, 25 G 25G X 5/8" MISC Use 1 needle weekly for subcutaneous testosterone injection 05/01/20   05/03/20 T, FNP  Syringe/Needle, Disp, (B-D ECLIPSE SYRINGE) 27G X 1/2" 1 ML MISC Use 1 syringe and needle weekly for testosterone injection 10/08/20   10/10/20, NP  testosterone cypionate (  DEPOTESTOSTERONE CYPIONATE) 200 MG/ML injection Inject 75 mg (0.375 mL) subcutaneously once weekly. 10/22/20   Verneda Skill, FNP    Allergies    Patient has no known allergies.  Review of Systems   Review of Systems  Constitutional:  Positive for chills and fever.  HENT:  Positive for sore throat. Negative for congestion, sinus pain and trouble swallowing.   Respiratory:  Negative for cough and shortness of breath.   Cardiovascular:  Negative for chest pain.  Gastrointestinal:  Positive  for abdominal pain, nausea and vomiting. Negative for diarrhea.  Genitourinary:  Negative for dysuria, flank pain and frequency.  Musculoskeletal:  Negative for myalgias.  Neurological:  Positive for headaches.  All other systems reviewed and are negative.  Physical Exam Updated Vital Signs BP 119/69 (BP Location: Left Arm)   Pulse 97   Temp 98.4 F (36.9 C) (Oral)   Resp 18   LMP 07/21/2021   SpO2 100%   Physical Exam Vitals and nursing note reviewed.  Constitutional:      General: He is not in acute distress.    Appearance: He is well-developed. He is not ill-appearing or diaphoretic.     Comments: Well-appearing and in no distress   HENT:     Head: Normocephalic and atraumatic.     Nose: No rhinorrhea.     Mouth/Throat:     Mouth: Mucous membranes are moist.     Pharynx: Oropharynx is clear.     Comments: Posterior oropharynx is erythematous with worsening tonsillar swelling on the right compared to left with a moderate amount of exudates, despite right-sided swelling uvula is midline, patient with no trismus, normal phonation and tolerating secretions without difficulty. Eyes:     General:        Right eye: No discharge.        Left eye: No discharge.  Neck:     Comments: No rigidity, no lymphadenopathy, no stridor Cardiovascular:     Rate and Rhythm: Normal rate and regular rhythm.     Heart sounds: Normal heart sounds. No murmur heard.   No friction rub. No gallop.  Pulmonary:     Effort: Pulmonary effort is normal. No respiratory distress.     Breath sounds: Normal breath sounds.     Comments: Respirations equal and unlabored, patient able to speak in full sentences, lungs clear to auscultation bilaterally  Abdominal:     General: Bowel sounds are normal. There is no distension.     Palpations: Abdomen is soft. There is no mass.     Tenderness: There is no abdominal tenderness. There is no guarding.     Comments: Abdomen soft, nondistended, very mild generalized  tenderness noted without guarding or peritoneal signs, does not localize to 1 quadrant  Musculoskeletal:        General: No deformity.     Cervical back: Neck supple.  Lymphadenopathy:     Cervical: No cervical adenopathy.  Skin:    General: Skin is warm and dry.     Capillary Refill: Capillary refill takes less than 2 seconds.  Neurological:     Mental Status: He is alert and oriented to person, place, and time.  Psychiatric:        Mood and Affect: Mood normal.        Behavior: Behavior normal.    ED Results / Procedures / Treatments   Labs (all labs ordered are listed, but only abnormal results are displayed) Labs Reviewed  CBC WITH DIFFERENTIAL/PLATELET -  Abnormal; Notable for the following components:      Result Value   WBC 14.1 (*)    Hemoglobin 15.3 (*)    Neutro Abs 11.2 (*)    Monocytes Absolute 1.1 (*)    All other components within normal limits  COMPREHENSIVE METABOLIC PANEL - Abnormal; Notable for the following components:   CO2 21 (*)    BUN 5 (*)    Total Protein 8.3 (*)    All other components within normal limits  RESP PANEL BY RT-PCR (FLU A&B, COVID) ARPGX2  LIPASE, BLOOD  PREGNANCY, URINE    EKG None  Radiology No results found.  Procedures Procedures   Medications Ordered in ED Medications  acetaminophen (TYLENOL) tablet 1,000 mg (1,000 mg Oral Given 07/22/21 1422)  sodium chloride 0.9 % bolus 1,000 mL (0 mLs Intravenous Stopped 07/22/21 1916)  ondansetron (ZOFRAN) injection 4 mg (4 mg Intravenous Given 07/22/21 1812)    ED Course  I have reviewed the triage vital signs and the nursing notes.  Pertinent labs & imaging results that were available during my care of the patient were reviewed by me and considered in my medical decision making (see chart for details).    MDM Rules/Calculators/A&P                           20 year old transgender female presents with sore throat, fever, nausea and vomiting, symptoms started 3 days ago and  patient was evaluated at urgent care yesterday, diagnosed with tonsillitis and prescribed Augmentin.  Did have negative rapid COVID and strep testing but was treated regardless based on patient's exam.  Today patient developed nausea and vomiting and has been unable to keep down Motrin and Tylenol for symptomatic control or antibiotics or return for further evaluation.  On arrival patient noted to be febrile at 103 and tachycardic at 143, fever treated with Tylenol and tachycardia has resolved, vitals otherwise normal and patient is well-appearing.  Does have worsening tonsillar swelling on the right compared to left but uvula is midline, patient without torticollis, change in phonation and is tolerating secretions without difficulty.  Very low suspicion for PTA or RPA.  Associated mild abdominal tenderness that does not localize.  Abdominal labs initiated from triage  I have independently ordered, reviewed and interpreted all labs and imaging: CBC: Leukocytosis of 14.1 as to be expected in the setting of infection, mildly elevated hemoglobin CMP: No significant electrolyte derangements, normal renal liver function Lipase: WNL Pregnancy: Negative COVID/flu: Negative  Patient treated with IV fluids and Zofran, tachycardia has resolved and patient is now tolerating p.o. fluids.  We will have patient continue with antibiotics and symptomatic control at home for tonsillitis.  Discussed return precautions and outpatient follow-up.  Prescribed Zofran to treat nausea and vomiting as needed at home and stressed the importance of remaining well-hydrated.  Patient expresses understanding and agreement with plan.  Discharged home in good condition.   Final Clinical Impression(s) / ED Diagnoses Final diagnoses:  Tonsillitis  Nausea and vomiting, unspecified vomiting type    Rx / DC Orders ED Discharge Orders          Ordered    ondansetron (ZOFRAN ODT) 4 MG disintegrating tablet        07/22/21 1907              Dartha Lodge, [place] 07/23/21 0008    Charlynne Pander, MD 07/25/21 [phone]

## 2021-07-22 NOTE — Discharge Instructions (Addendum)
Use Zofran as needed for nausea and vomiting.  Make sure you are drinking plenty of fluids to stay hydrated.  Continue using Motrin and Tylenol as needed for fever and sore throat.  Take antibiotics as prescribed for tonsillitis.  Return to the emergency department if you have persistent vomiting despite nausea medicines, worsening sore throat despite antibiotics, difficulty swallowing, neck pain or swelling, change in voice or other new or concerning symptoms.

## 2021-07-22 NOTE — ED Provider Notes (Signed)
Emergency Medicine Provider Triage Evaluation Note  Laurie [name] , a 20 y.o. adult  was evaluated in triage.  Pt complains of nausea, vomiting, sore throat, fatigue, fever, chills.  Patient states that she is tested yesterday at urgent care and found to have a negative rapid antigen test as well as negative strep.  She states that she was told to take Tylenol ibuprofen but could not get these medications down because of vomiting.  She denies any abdominal pain  States that she also has a sore throat which is aching and severe.  Denies any cough or hemoptysis  Review of Systems  Positive: Fever, nausea, vomiting, sore throat Negative: Chest pain  Physical Exam  BP (!) 141/91 (BP Location: Left Arm)   Pulse (!) 143   Temp (!) 103 F (39.4 C)   Resp 18   LMP 07/21/2021   SpO2 99%  Gen:   Awake, uncomfortable appearing 20 year old Resp:  Normal effort MSK:   Moves extremities without difficulty  Other:  Posterior pharyngeal erythema.  Unilateral tonsillar hypertrophy.  Uvula midline.  Normal phonation. Mild generalized abdominal tenderness  Medical Decision Making  Medically screening exam initiated at 2:18 PM.  Appropriate orders placed.  Laurie [name] was informed that the remainder of the evaluation will be completed by another provider, this initial triage assessment does not replace that evaluation, and the importance of remaining in the ED until their evaluation is complete.  Patient with fever will treat with Tylenol and Zofran here in the ER.  Will check urine and abdominal labs given some generalized abdominal tenderness.  Symptoms concerning for viral illness Screening labs ordered.  COVID test PCR obtained.   Laurie [name], [place] 07/22/21 1421    Laurie Plan, DO 07/23/21 0720

## 2021-10-14 IMAGING — DX DG FOOT COMPLETE 3+V*R*
3 series · 3 of 3 positions shown · non-contrast
Comparison: None

CLINICAL DATA: Dropped log on foot on 07/10/2021, 19-year-old
female.

EXAM:
RIGHT FOOT COMPLETE - 3+ VIEW

[foot ap]
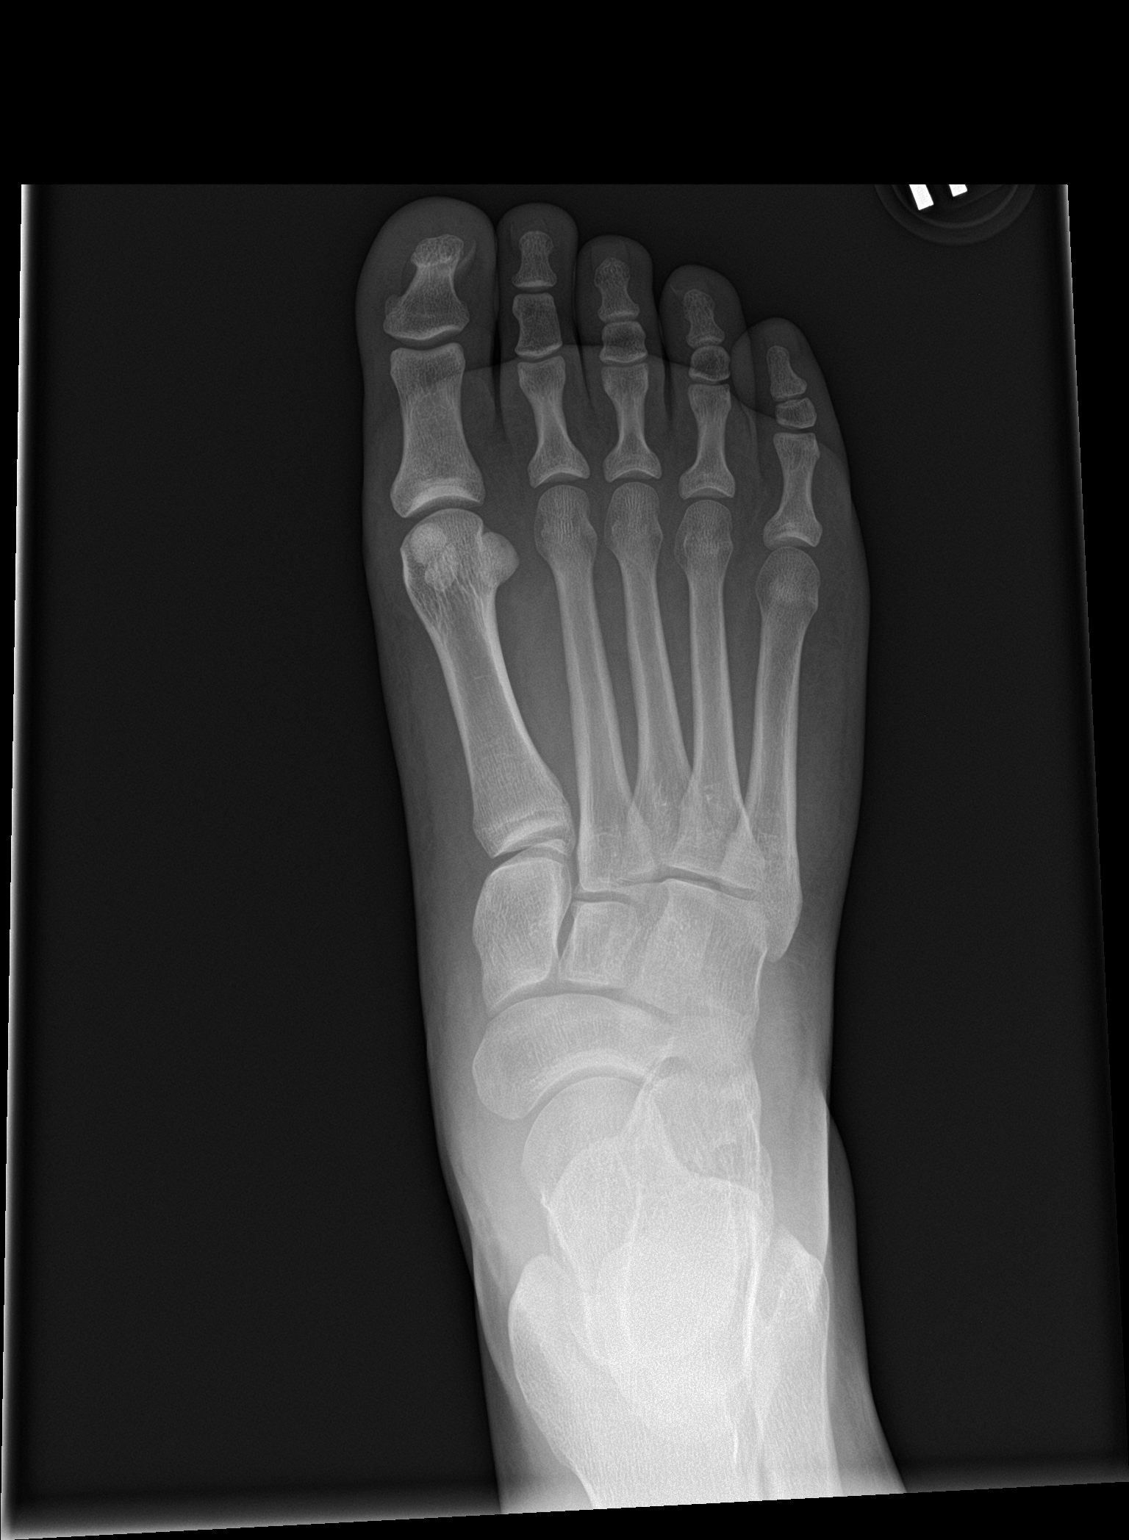

[foot obl]
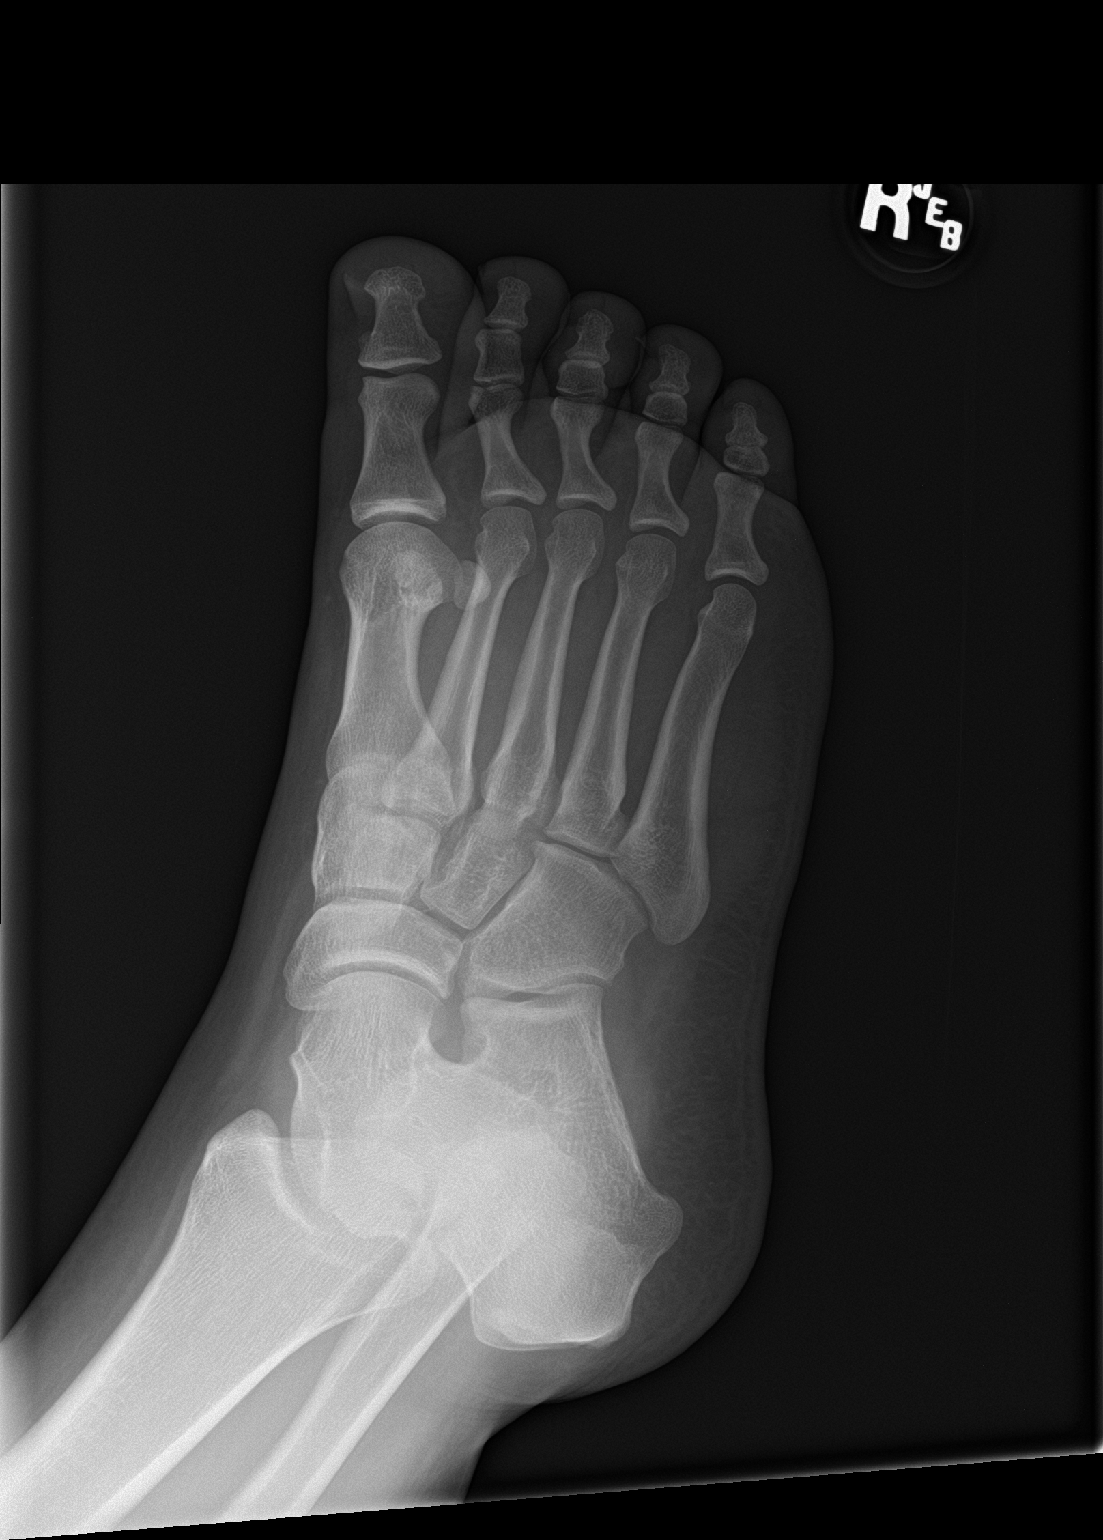

[foot lat]
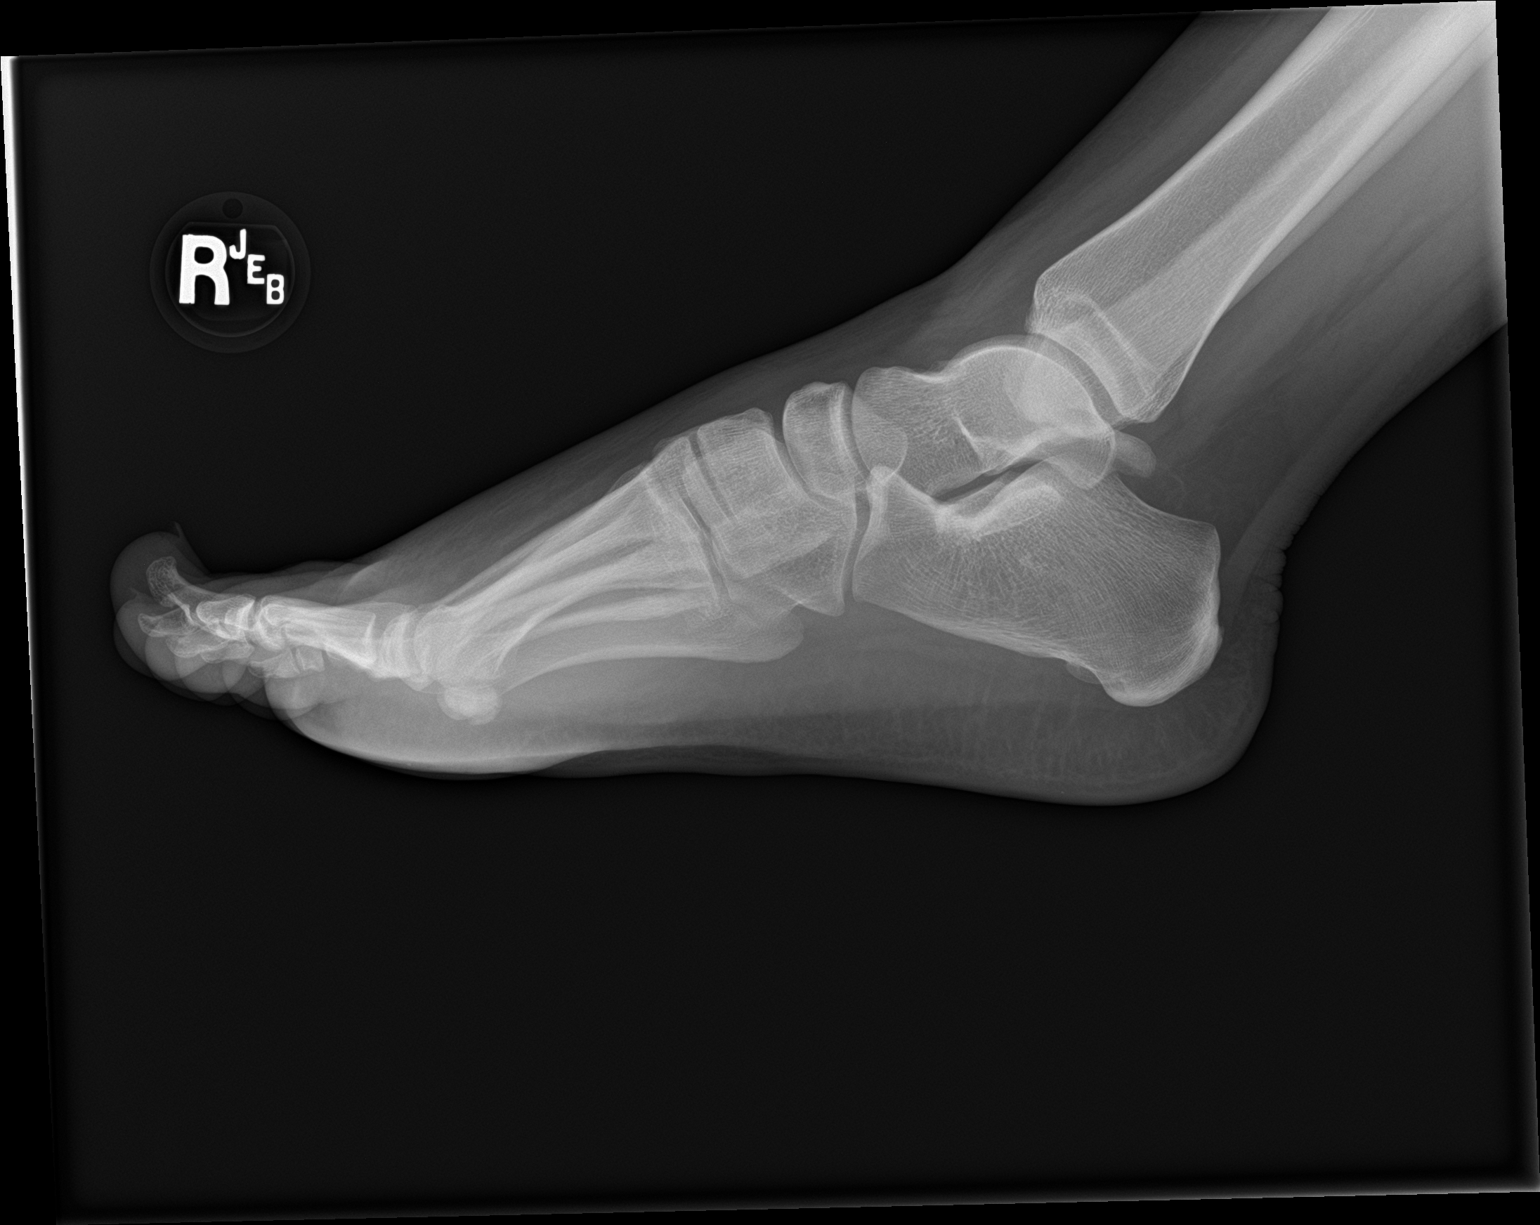

[3 of 3 positions shown; findings below may reference images not displayed]

FINDINGS: No substantial soft tissue swelling. No sign of fracture or
dislocation.
IMPRESSION: Negative.

## 2022-01-13 DIAGNOSIS — H5213 Myopia, bilateral: Secondary | ICD-10-CM | POA: Diagnosis not present

## 2022-04-21 ENCOUNTER — Encounter (HOSPITAL_COMMUNITY): Payer: Self-pay | Admitting: *Deleted

## 2022-04-21 ENCOUNTER — Other Ambulatory Visit: Payer: Self-pay

## 2022-04-21 ENCOUNTER — Ambulatory Visit (HOSPITAL_COMMUNITY)
Admission: EM | Admit: 2022-04-21 | Discharge: 2022-04-21 | Disposition: A | Discharge status: Home / Self Care | Payer: Medicaid Other | Attending: Emergency Medicine | Admitting: Emergency Medicine

## 2022-04-21 DIAGNOSIS — J069 Acute upper respiratory infection, unspecified: Secondary | ICD-10-CM

## 2022-04-21 MED ORDER — ONDANSETRON HCL 4 MG PO TABS: 4.0000 mg | ORAL_TABLET | Freq: Four times a day (QID) | ORAL | 0 refills | Status: DC | PRN

## 2022-04-21 NOTE — ED Provider Notes (Signed)
[facility]    CSN: [id number] Arrival date & time: 04/21/22  1845     History   Chief Complaint Chief Complaint  Patient presents with   Otalgia   Nasal Congestion   Emesis    HPI Laurie [name] is a 21 y.o. adult.  Presents with 1 week history of chest congestion and cough. Reports some green mucous production. Sinus congestion as well, feels drainage in her throat. Left ear pain that began today. No drainage or hearing problems. Vomited once a day for the last 3 days, mostly stomach acid. Also reports episodes of diarrhea.  No abdominal pain, fevers, rash, weakness, headache.  Tried mucinex for a few days. Has been using cough drops and salt water gargles that help a lot.  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/28/2018   Prediabetes 02/08/2016   Vitamin D deficiency 12/21/2014    Patient Active Problem List   Diagnosis Date Noted   Endocrine disorder, unspecified 11/21/2019   Menstrual cramps 02/08/2016   Frequent headaches 12/21/2014   BMI (body mass index), pediatric, greater than or equal to 95% for age 23/07/2015    History reviewed. No pertinent surgical history.  OB History   No obstetric history on file.      Home Medications    Prior to Admission medications   Medication Sig Start Date End Date Taking? Authorizing Provider  ondansetron (ZOFRAN) 4 MG tablet Take 1 tablet (4 mg total) by mouth every 6 (six) hours as needed for nausea or vomiting. 04/21/22  Yes Leeasia Secrist, Lurena Joiner, PA-C  medroxyPROGESTERone (DEPO-PROVERA) 150 MG/ML injection Inject 1 mL (150 mg total) into the muscle every 3 (three) months. 08/15/20   Verneda Skill, FNP  NEEDLE, DISP, 25 G 25G X 5/8" MISC Use 1 needle weekly for subcutaneous testosterone injection 05/01/20   Alfonso Ramus T, FNP  Syringe/Needle, Disp, (B-D ECLIPSE SYRINGE) 27G X 1/2" 1 ML MISC Use 1 syringe and needle weekly for testosterone injection 10/08/20   Georges Mouse,  NP  testosterone cypionate (DEPOTESTOSTERONE CYPIONATE) 200 MG/ML injection Inject 75 mg (0.375 mL) subcutaneously once weekly. 10/22/20   Verneda Skill, FNP    Family History Family History  Problem Relation Age of Onset   Cancer Maternal Grandmother    Alcohol abuse Maternal Grandmother    Diabetes Maternal Grandfather    Heart disease Maternal Grandfather    Hyperlipidemia Maternal Grandfather    Hypertension Maternal Grandfather    Alcohol abuse Maternal Grandfather    Obesity Paternal Grandmother    Hyperlipidemia Paternal Grandmother    Hypertension Paternal Grandmother    Obesity Paternal Grandfather    Heart disease Paternal Grandfather        A fib related to history of rheumatic heart disease   Diabetes Paternal Grandfather    Asthma Neg Hx     Social History Social History   Tobacco Use   Smoking status: Never   Smokeless tobacco: Never  Substance Use Topics   Alcohol use: Never   Drug use: Never     Allergies   Patient has no known allergies.   Review of Systems Review of Systems  HENT:  Positive for ear pain.   Gastrointestinal:  Positive for vomiting.   Per HPI  Physical Exam Triage Vital Signs ED Triage Vitals  Enc Vitals Group     BP 04/21/22 1927 102/69     Pulse Rate 04/21/22 1927 74     Resp  04/21/22 1927 18     Temp 04/21/22 1927 98.8 F (37.1 C)     Temp src --      SpO2 04/21/22 1927 100 %     Weight --      Height --      Head Circumference --      Peak Flow --      Pain Score 04/21/22 1925 2     Pain Loc --      Pain Edu? --      Excl. in GC? --    No data found.  Updated Vital Signs BP 102/69   Pulse 74   Temp 98.8 F (37.1 C)   Resp 18   LMP 03/22/2022   SpO2 100%    Physical Exam Vitals and nursing note reviewed.  Constitutional:      Comments: Very pleasant and appears well, no acute distress  HENT:     Head: Normocephalic and atraumatic.     Right Ear: Tympanic membrane and ear canal normal.      Left Ear: Tympanic membrane and ear canal normal.     Nose: Nose normal.     Mouth/Throat:     Mouth: Mucous membranes are moist.     Pharynx: Oropharynx is clear. Uvula midline. No posterior oropharyngeal erythema.     Tonsils: No tonsillar exudate or tonsillar abscesses.  Eyes:     Conjunctiva/sclera: Conjunctivae normal.     Pupils: Pupils are equal, round, and reactive to light.  Cardiovascular:     Rate and Rhythm: Normal rate and regular rhythm.     Pulses: Normal pulses.     Heart sounds: Normal heart sounds.  Pulmonary:     Effort: Pulmonary effort is normal. No respiratory distress.     Breath sounds: Normal breath sounds.  Abdominal:     General: Bowel sounds are normal.     Palpations: Abdomen is soft.     Tenderness: There is no abdominal tenderness.  Musculoskeletal:        General: Normal range of motion.     Cervical back: Normal range of motion.  Lymphadenopathy:     Cervical: No cervical adenopathy.  Skin:    Findings: No rash.  Neurological:     General: No focal deficit present.     Sensory: No sensory deficit.     Motor: No weakness.     Coordination: Coordination normal.     Gait: Gait normal.     UC Treatments / Results  Labs (all labs ordered are listed, but only abnormal results are displayed) Labs Reviewed - No data to display  EKG  Radiology No results found.  Procedures Procedures (including critical care time)  Medications Ordered in UC Medications - No data to display  Initial Impression / Assessment and Plan / UC Course  I have reviewed the triage vital signs and the nursing notes.  Pertinent labs & imaging results that were available during my care of the patient were reviewed by me and considered in my medical decision making (see chart for details).  Exam unremarkable. Very well-appearing and vitals are stable.  Recommend Mucinex twice a day for cough and congestion.  Can also add Benadryl at nighttime for congestion and sleep.   Recommend Flonase nasal spray in the morning for congestion and ear pain.  I do believe the left ear pain is due to congestion.  No sign of infection.  Drinks a lot of water and I recommend continue this.  Can do Tylenol or ibuprofen for pain or fever. Prescribed zofran to try in morning for nausea, 30 minutes before meal. Understands to return to the urgent care if symptoms are persistent.  Emergency department precautions.  Patient agrees to plan and is discharged in stable condition.  Final Clinical Impressions(s) / UC Diagnoses   Final diagnoses:  Upper respiratory tract infection, unspecified type     Discharge Instructions      Take mucinex twice daily for cough and congestion. You can try Benadryl at night time as well.  Try daily Flonase to help with congestion, sore throat, and ear pain. Continue cough drops as needed. Warm salt water gargles. Continue to drink lots of water.  Try the zofran 30 minutes before a meal to help with nausea and vomiting.  Please go to the emergency department if symptoms worsen.     ED Prescriptions     Medication Sig Dispense Auth. Provider   ondansetron (ZOFRAN) 4 MG tablet Take 1 tablet (4 mg total) by mouth every 6 (six) hours as needed for nausea or vomiting. 12 tablet Efstathios Sawin, Lurena Joiner, PA-C      PDMP not reviewed this encounter.   Janete Quilling, Ray [place] 04/21/22 2002

## 2022-04-21 NOTE — Discharge Instructions (Addendum)
Take mucinex twice daily for cough and congestion. You can try Benadryl at night time as well.  Try daily Flonase to help with congestion, sore throat, and ear pain. Continue cough drops as needed. Warm salt water gargles. Continue to drink lots of water.  Try the zofran 30 minutes before a meal to help with nausea and vomiting.  Please go to the emergency department if symptoms worsen.

## 2022-04-21 NOTE — ED Triage Notes (Signed)
Pt reports Lt ear pain with chest congestion. Pt also reports she vomited 3 times over the past 3 days.

## 2022-04-22 ENCOUNTER — Ambulatory Visit: Payer: Medicaid Other

## 2022-04-30 ENCOUNTER — Encounter: Payer: Self-pay | Admitting: Pediatrics

## 2022-05-01 ENCOUNTER — Telehealth: Payer: Medicaid Other

## 2022-05-02 ENCOUNTER — Telehealth: Payer: Self-pay | Admitting: Pediatrics

## 2022-05-02 NOTE — Telephone Encounter (Signed)
[employer] Vaccine form complete and sent to [facility] by fax [phone] and to media to scan.

## 2022-05-02 NOTE — Telephone Encounter (Signed)
Please fax shots record to [facility] the fax #[phone]

## 2022-05-07 ENCOUNTER — Ambulatory Visit (HOSPITAL_COMMUNITY)
Admission: RE | Admit: 2022-05-07 | Discharge: 2022-05-07 | Disposition: A | Discharge status: Home / Self Care | Payer: Medicaid Other | Source: Ambulatory Visit | Attending: Physician Assistant | Admitting: Physician Assistant

## 2022-05-07 ENCOUNTER — Encounter (HOSPITAL_COMMUNITY): Payer: Self-pay

## 2022-05-07 VITALS — BP 107/72 | HR 75 | Temp 99.2°F | Resp 16 | Ht 61.0 in | Wt 124.0 lb

## 2022-05-07 DIAGNOSIS — R634 Abnormal weight loss: Secondary | ICD-10-CM

## 2022-05-07 DIAGNOSIS — R112 Nausea with vomiting, unspecified: Secondary | ICD-10-CM | POA: Diagnosis present

## 2022-05-07 DIAGNOSIS — K2970 Gastritis, unspecified, without bleeding: Secondary | ICD-10-CM | POA: Diagnosis not present

## 2022-05-07 DIAGNOSIS — R002 Palpitations: Secondary | ICD-10-CM

## 2022-05-07 DIAGNOSIS — R5383 Other fatigue: Secondary | ICD-10-CM | POA: Diagnosis not present

## 2022-05-07 DIAGNOSIS — K2990 Gastroduodenitis, unspecified, without bleeding: Secondary | ICD-10-CM

## 2022-05-07 LAB — CBC WITH DIFFERENTIAL/PLATELET
Abs Immature Granulocytes: 0.01 10*3/uL (ref 0.00–0.07)
Basophils Absolute: 0.1 10*3/uL (ref 0.0–0.1)
Basophils Relative: 1 %
Eosinophils Absolute: 0.1 10*3/uL (ref 0.0–0.5)
Eosinophils Relative: 2 %
HCT: 40.4 % (ref 36.0–46.0)
Hemoglobin: 14 g/dL (ref 12.0–15.0)
Immature Granulocytes: 0 %
Lymphocytes Relative: 40 %
Lymphs Abs: 2.5 10*3/uL (ref 0.7–4.0)
MCH: 31.2 pg (ref 26.0–34.0)
MCHC: 34.7 g/dL (ref 30.0–36.0)
MCV: 90 fL (ref 80.0–100.0)
Monocytes Absolute: 0.4 10*3/uL (ref 0.1–1.0)
Monocytes Relative: 7 %
Neutro Abs: 3.2 10*3/uL (ref 1.7–7.7)
Neutrophils Relative %: 50 %
Platelets: 321 10*3/uL (ref 150–400)
RBC: 4.49 MIL/uL (ref 3.87–5.11)
RDW: 12.8 % (ref 11.5–15.5)
WBC: 6.3 10*3/uL (ref 4.0–10.5)
nRBC: 0 % (ref 0.0–0.2)

## 2022-05-07 LAB — COMPREHENSIVE METABOLIC PANEL
ALT: 18 U/L (ref 0–44)
AST: 26 U/L (ref 15–41)
Albumin: 4.2 g/dL (ref 3.5–5.0)
Alkaline Phosphatase: 51 U/L (ref 38–126)
Anion gap: 6 (ref 5–15)
BUN: 7 mg/dL (ref 6–20)
CO2: 24 mmol/L (ref 22–32)
Calcium: 9.4 mg/dL (ref 8.9–10.3)
Chloride: 109 mmol/L (ref 98–111)
Creatinine, Ser: 0.66 mg/dL (ref 0.44–1.00)
GFR, Estimated: >60 mL/min (ref >60)
Glucose, Bld: 87 mg/dL (ref 70–99)
Potassium: 3.9 mmol/L (ref 3.5–5.1)
Sodium: 139 mmol/L (ref 135–145)
Total Bilirubin: 0.5 mg/dL (ref 0.3–1.2)
Total Protein: 6.9 g/dL (ref 6.5–8.1)

## 2022-05-07 LAB — POCT URINALYSIS DIPSTICK, ED / UC
Glucose, UA: NEGATIVE mg/dL
Nitrite: NEGATIVE
Protein, ur: NEGATIVE mg/dL
Specific Gravity, Urine: >=1.03 (ref 1.005–1.030)
Urobilinogen, UA: 0.2 mg/dL (ref 0.0–1.0)
pH: 5.5 (ref 5.0–8.0)

## 2022-05-07 LAB — TSH: TSH: 1.503 u[IU]/mL (ref 0.350–4.500)

## 2022-05-07 LAB — T4, FREE: Free T4: 1.15 ng/dL — ABNORMAL HIGH (ref 0.61–1.12)

## 2022-05-07 MED ORDER — ONDANSETRON HCL 4 MG PO TABS: 4.0000 mg | ORAL_TABLET | Freq: Four times a day (QID) | ORAL | 0 refills | Status: DC | PRN

## 2022-05-07 MED ORDER — FAMOTIDINE 20 MG PO TABS: 20.0000 mg | ORAL_TABLET | Freq: Two times a day (BID) | ORAL | 1 refills | Status: DC

## 2022-05-07 NOTE — ED Triage Notes (Signed)
Patient having dizziness and not being able to keep food down for over a year. States they lost a lot of weight during this time.   States yesterday Patient noticed excessive sweat coming from her right armpit while the left would stay dry. No dietary or medication changes.   Patient states their heart has been fluttering. States it feels like their heart will skip a beat for a second and then go back to normal.   States their mensis was abnormally heavy this month as well.

## 2022-05-07 NOTE — Discharge Instructions (Addendum)
I have drawn for a complete blood count, comprehensive metabolic profile, a thyroid function.  These test will be completed in 48 hours, if you do not receive a call from our office that indicates the test results are normal.  If you have access to MyChart then you can go online and review the results. Advised to take the Zofran tablets 1 every 6 hours 30 minutes before a meal to see if this helps with the nausea and vomiting. Advised to take Pepcid 20 mg twice daily until completed to see if this helps improve the indigestion or reflux. Advised to follow-up with PCP for continued investigation into your symptoms, or return to urgent care if symptoms fail to improve.

## 2022-05-07 NOTE — ED Provider Notes (Signed)
[facility]    CSN: [id number] Arrival date & time: 05/07/22  1035      History   Chief Complaint Chief Complaint  Patient presents with   Dizziness   Nausea   Excessive Sweating    HPI Laurie [name] is a 21 y.o. adult.   21 year old female presents with weight loss, nausea and vomiting, fatigue.  Patient indicates for the past year she has been having continued progressive symptoms of becoming nauseated and throwing up after meals.  Patient indicates that this usually occurs before eating with the nausea, then after she eats about 10 to 15 minutes later she will become nauseated and throw up.  Patient indicates she does have some episodes of indigestion and reflux which are intermittent sometimes related to the nausea and vomiting at other times symptoms or not.  Patient indicates she has lost extensive weight over the past 6 months going from 188 pounds down to 126.  Patient indicates she has not been trying to lose weight.  Patient also indicates that she has been having some intermittent palpitations, skipping heart, although she indicates this is not frequent and she is able to run 1 mile 3 days a week without any problems or chest discomfort.  Patient also indicates she is having increased sweating particularly worse underneath the right arm and it is always less underneath the left.  Patient indicates she has had some intermittent episodes of constipation and diarrhea and sometimes the diarrhea is after she eats.  Patient indicates there is no blood in the bowels or dark stools.  Patient also indicates that she had abnormal menses to where her last period was July 16 lasting for several days longer than usual and having a heavier flow than what she is used to.  Patient indicates that the middle of June her cycle was normal.  Patient indicates she is not under any increased stress or pressure although she is due to restart college her sophomore year.  Patient does smoke  although it is intermittently, when she does she vapes.  Patient relates she does not have any fever or chills.  Patient does indicates that she is concerned about having thyroid abnormalities desires to have her thyroid checked.  She does indicates she has a family practice physician that she has not seen in the past couple months.   Dizziness Associated symptoms: diarrhea     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/28/2018   Prediabetes 02/08/2016   Vitamin D deficiency 12/21/2014    Patient Active Problem List   Diagnosis Date Noted   Endocrine disorder, unspecified 11/21/2019   Menstrual cramps 02/08/2016   Frequent headaches 12/21/2014   BMI (body mass index), pediatric, greater than or equal to 95% for age 76/07/2015    History reviewed. No pertinent surgical history.  OB History   No obstetric history on file.      Home Medications    Prior to Admission medications   Medication Sig Start Date End Date Taking? Authorizing Provider  famotidine (PEPCID) 20 MG tablet Take 1 tablet (20 mg total) by mouth 2 (two) times daily. 05/07/22  Yes Nyoka Lint, PA-C  Ferrous Sulfate (IRON PO) Take by mouth.   Yes [provider]  Multiple Vitamin (MULTI-VITAMIN DAILY PO) Take by mouth.   Yes [provider]  medroxyPROGESTERone (DEPO-PROVERA) 150 MG/ML injection Inject 1 mL (150 mg total) into the muscle every 3 (three) months. 08/15/20   Jonathon Resides  T, FNP  NEEDLE, DISP, 25 G 25G X 5/8" MISC Use 1 needle weekly for subcutaneous testosterone injection 05/01/20   Trude Mcburney, FNP  ondansetron (ZOFRAN) 4 MG tablet Take 1 tablet (4 mg total) by mouth every 6 (six) hours as needed for nausea or vomiting. 05/07/22   Nyoka Lint, PA-C  Syringe/Needle, Disp, (B-D ECLIPSE SYRINGE) 27G X 1/2" 1 ML MISC Use 1 syringe and needle weekly for testosterone injection 10/08/20   Parthenia Ames, NP  testosterone cypionate (DEPOTESTOSTERONE  CYPIONATE) 200 MG/ML injection Inject 75 mg (0.375 mL) subcutaneously once weekly. 10/22/20   Trude Mcburney, FNP    Family History Family History  Problem Relation Age of Onset   Cancer Maternal Grandmother    Alcohol abuse Maternal Grandmother    Diabetes Maternal Grandfather    Heart disease Maternal Grandfather    Hyperlipidemia Maternal Grandfather    Hypertension Maternal Grandfather    Alcohol abuse Maternal Grandfather    Obesity Paternal Grandmother    Hyperlipidemia Paternal Grandmother    Hypertension Paternal Grandmother    Obesity Paternal Grandfather    Heart disease Paternal Grandfather        A fib related to history of rheumatic heart disease   Diabetes Paternal Grandfather    Asthma Neg Hx     Social History Social History   Tobacco Use   Smoking status: Never   Smokeless tobacco: Never  Vaping Use   Vaping Use: Some days  Substance Use Topics   Alcohol use: Never   Drug use: Never     Allergies   Patient has no known allergies.   Review of Systems Review of Systems  Constitutional:  Positive for diaphoresis, fatigue and unexpected weight change.  Gastrointestinal:  Positive for abdominal pain (indigestion), constipation and diarrhea.  Neurological:  Positive for dizziness.     Physical Exam Triage Vital Signs ED Triage Vitals  Enc Vitals Group     BP 05/07/22 1059 107/72     Pulse Rate 05/07/22 1059 75     Resp 05/07/22 1059 16     Temp 05/07/22 1059 99.2 F (37.3 C)     Temp Source 05/07/22 1059 Oral     SpO2 05/07/22 1059 98 %     Weight 05/07/22 1057 124 lb (56.2 kg)     Height 05/07/22 1057 5\' 1"  (1.549 m)     Head Circumference --      Peak Flow --      Pain Score 05/07/22 1057 0     Pain Loc --      Pain Edu? --      Excl. in [place]? --    No data found.  Updated Vital Signs BP 107/72 (BP Location: Left Arm)   Pulse 75   Temp 99.2 F (37.3 C) (Oral)   Resp 16   Ht 5\' 1"  (1.549 m)   Wt 124 lb (56.2 kg)   LMP  04/27/2022 (Within Days)   SpO2 98%   BMI 23.43 kg/m   Visual Acuity Right Eye Distance:   Left Eye Distance:   Bilateral Distance:    Right Eye Near:   Left Eye Near:    Bilateral Near:     Physical Exam Constitutional:      Appearance: Normal appearance.  HENT:     Right Ear: Tympanic membrane and ear canal normal.     Left Ear: Tympanic membrane and ear canal normal.     Mouth/Throat:  Mouth: Mucous membranes are moist.     Pharynx: Oropharynx is clear. Uvula midline. No pharyngeal swelling.  Cardiovascular:     Rate and Rhythm: Normal rate and regular rhythm.     Heart sounds: Normal heart sounds.  Pulmonary:     Effort: Pulmonary effort is normal.     Breath sounds: Normal breath sounds and air entry. No wheezing, rhonchi or rales.  Abdominal:     General: Abdomen is flat. Bowel sounds are normal.     Palpations: Abdomen is soft.     Tenderness: There is no abdominal tenderness. There is no guarding or rebound.  Lymphadenopathy:     Cervical: No cervical adenopathy.  Neurological:     Mental Status: He is alert and oriented to person, place, and time.     Cranial Nerves: Cranial nerves 2-12 are intact.     Motor: Motor function is intact.     Coordination: Coordination is intact.      UC Treatments / Results  Labs (all labs ordered are listed, but only abnormal results are displayed) Labs Reviewed  POCT URINALYSIS DIPSTICK, ED / UC - Abnormal; Notable for the following components:      Result Value   Bilirubin Urine SMALL (*)    Ketones, ur TRACE (*)    Hgb urine dipstick TRACE (*)    Leukocytes,Ua TRACE (*)    All other components within normal limits  URINE CULTURE  CBC WITH DIFFERENTIAL/PLATELET  COMPREHENSIVE METABOLIC PANEL  TSH  T4, FREE    EKG   Radiology No results found.  Procedures Procedures (including critical care time)  Medications Ordered in UC Medications - No data to display  Initial Impression / Assessment and Plan /  UC Course  I have reviewed the triage vital signs and the nursing notes.  Pertinent labs & imaging results that were available during my care of the patient were reviewed by me and considered in my medical decision making (see chart for details).    Plan: 1.  CBC, CMP, TSH, free T4 labs are pending. 2.  Advised to take the Zofran 1 every 6-8 hours 30 minutes before a meal to see if this helps decrease her nausea and vomiting. 3.  Advised to take Pepcid 20 mg 1 twice daily to see if this helps with the gastric irritation. 4.  Advised to follow-up with PCP so that further investigation can be initiated into her symptoms. 5.  Advised to return to urgent care if symptoms fail to improve. 6.  Urine cultures pending Final Clinical Impressions(s) / UC Diagnoses   Final diagnoses:  Nausea and vomiting, unspecified vomiting type  Loss of weight  Gastritis and gastroduodenitis  Palpitations  Other fatigue     Discharge Instructions      I have drawn for a complete blood count, comprehensive metabolic profile, a thyroid function.  These test will be completed in 48 hours, if you do not receive a call from our office that indicates the test results are normal.  If you have access to MyChart then you can go online and review the results. Advised to take the Zofran tablets 1 every 6 hours 30 minutes before a meal to see if this helps with the nausea and vomiting. Advised to take Pepcid 20 mg twice daily until completed to see if this helps improve the indigestion or reflux. Advised to follow-up with PCP for continued investigation into your symptoms, or return to urgent care if symptoms fail to improve.  ED Prescriptions     Medication Sig Dispense Auth. Provider   ondansetron (ZOFRAN) 4 MG tablet Take 1 tablet (4 mg total) by mouth every 6 (six) hours as needed for nausea or vomiting. 24 tablet Ellsworth Lennox, PA-C   famotidine (PEPCID) 20 MG tablet Take 1 tablet (20 mg total) by mouth 2  (two) times daily. 30 tablet Ellsworth Lennox, PA-C      PDMP not reviewed this encounter.   Ellsworth Lennox, PA-C 05/07/22 1159

## 2022-05-08 LAB — URINE CULTURE

## 2022-07-28 ENCOUNTER — Encounter: Payer: Self-pay | Admitting: Family Medicine

## 2022-07-28 ENCOUNTER — Ambulatory Visit (INDEPENDENT_AMBULATORY_CARE_PROVIDER_SITE_OTHER): Payer: Medicaid Other | Admitting: Family Medicine

## 2022-07-28 VITALS — BP 115/74 | HR 84 | Temp 98.1°F | Resp 116 | Ht 61.0 in | Wt 128.0 lb

## 2022-07-28 DIAGNOSIS — Z7689 Persons encountering health services in other specified circumstances: Secondary | ICD-10-CM

## 2022-07-28 DIAGNOSIS — R197 Diarrhea, unspecified: Secondary | ICD-10-CM

## 2022-07-28 DIAGNOSIS — R634 Abnormal weight loss: Secondary | ICD-10-CM | POA: Diagnosis not present

## 2022-07-28 DIAGNOSIS — R5383 Other fatigue: Secondary | ICD-10-CM | POA: Diagnosis not present

## 2022-07-28 NOTE — Progress Notes (Unsigned)
New Patient Office Visit  Subjective    Patient ID: Laurie [name], adult    DOB: 2001/06/27  Age: 21 y.o. MRN: [id number]  CC:  Chief Complaint  Patient presents with   Establish Care    HPI Laurie [name] presents to establish care   Outpatient Encounter Medications as of 07/28/2022  Medication Sig   famotidine (PEPCID) 20 MG tablet Take 1 tablet (20 mg total) by mouth 2 (two) times daily.   Ferrous Sulfate (IRON PO) Take by mouth.   medroxyPROGESTERone (DEPO-PROVERA) 150 MG/ML injection Inject 1 mL (150 mg total) into the muscle every 3 (three) months. (Patient not taking: Reported on 07/28/2022)   Multiple Vitamin (MULTI-VITAMIN DAILY PO) Take by mouth.   NEEDLE, DISP, 25 G 25G X 5/8" MISC Use 1 needle weekly for subcutaneous testosterone injection (Patient not taking: Reported on 07/28/2022)   ondansetron (ZOFRAN) 4 MG tablet Take 1 tablet (4 mg total) by mouth every 6 (six) hours as needed for nausea or vomiting.   Syringe/Needle, Disp, (B-D ECLIPSE SYRINGE) 27G X 1/2" 1 ML MISC Use 1 syringe and needle weekly for testosterone injection (Patient not taking: Reported on 07/28/2022)   testosterone cypionate (DEPOTESTOSTERONE CYPIONATE) 200 MG/ML injection Inject 75 mg (0.375 mL) subcutaneously once weekly. (Patient not taking: Reported on 07/28/2022)   No facility-administered encounter medications on file as of 07/28/2022.    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/28/2018   Prediabetes 02/08/2016   Vitamin D deficiency 12/21/2014    No past surgical history on file.  Family History  Problem Relation Age of Onset   Cancer Maternal Grandmother    Alcohol abuse Maternal Grandmother    Diabetes Maternal Grandfather    Heart disease Maternal Grandfather    Hyperlipidemia Maternal Grandfather    Hypertension Maternal Grandfather    Alcohol abuse Maternal Grandfather    Obesity Paternal Grandmother    Hyperlipidemia Paternal  Grandmother    Hypertension Paternal Grandmother    Obesity Paternal Grandfather    Heart disease Paternal Grandfather        A fib related to history of rheumatic heart disease   Diabetes Paternal Grandfather    Asthma Neg Hx     Social History   Socioeconomic History   Marital status: Single    Spouse name: Not on file   Number of children: Not on file   Years of education: Not on file   Highest education level: Not on file  Occupational History   Not on file  Tobacco Use   Smoking status: Never   Smokeless tobacco: Never  Vaping Use   Vaping Use: Some days  Substance and Sexual Activity   Alcohol use: Never   Drug use: Never   Sexual activity: Not on file  Other Topics Concern   Not on file  Social History Narrative   Not on file   Social Determinants of Health   Financial Resource Strain: Not on file  Food Insecurity: Not on file  Transportation Needs: Not on file  Physical Activity: Not on file  Stress: Not on file  Social Connections: Not on file  Intimate Partner Violence: Not on file    ROS      Objective    BP 115/74   Pulse 84   Temp 98.1 F (36.7 C) (Oral)   Resp (!) 116   Ht 5\' 1"  (1.549 m)   Wt 128 lb (58.1 kg)   SpO2 99%  BMI 24.19 kg/m   Physical Exam  {Labs (Optional):23779}    Assessment & Plan:   1. Loss of weight  - Ambulatory referral to Gastroenterology  2. Bloody diarrhea  - Ambulatory referral to Gastroenterology  3. Other fatigue  - Ambulatory referral to Gastroenterology  4. Encounter to establish care     No follow-ups on file.   Tommie Raymond, MD

## 2022-07-28 NOTE — Progress Notes (Unsigned)
Patient is new to provider [facility]. Patient c/o lack of energy,weight loss,N/V with throwing bile up x 7 months

## 2022-07-30 ENCOUNTER — Encounter: Payer: Self-pay | Admitting: Family Medicine

## 2022-08-04 ENCOUNTER — Encounter: Payer: Self-pay | Admitting: Physician Assistant

## 2022-09-09 NOTE — Progress Notes (Unsigned)
09/10/2022 Laurie [name] [id number] October 02, 2001  Referring provider: Dorna Mai, MD Primary GI doctor: Dr. Loletha Carrow  ASSESSMENT AND PLAN:   Diarrhea up to twice a day with rectal bleeding, with nausea and bilious vomiting, 60 pound weight loss since January - stool samples to rule out infection with UGI and LGI symptoms thought chronicity makes this less likely, but will check in case this is IBD.  -Fecal calprotectin and CRP/ESR to rule out inflammation/IBD which is very concerning with family history and symptoms.   -TTG/IGA to evaluate for celiac disease.  Can do trial of IBGARD daily, will give Levsin as needed in the mean time  FODMAP, trial off lactulose and lifestyle changes discussed -We will check H. pylori stool, given Zofran disintegrating tablet.   We will suggest taking zofran prior to doing prep for colonoscopy Imodium as needed for now, can take prior to eating or travel Will schedule for endoscopic evaluation with EGD and colonoscopy to rule out IBD, discussed with patient and agrees with plan. Consider SIBO testing or xifaxin trial if negative   Patient Care Team: Dorna Mai, MD as PCP - General (Family Medicine)  HISTORY OF PRESENT ILLNESS: 21 y.o. adult with a past medical history of vitamin D deficiency, prediabetes and others listed below presents for evaluation of weight loss, fatigue, bloody diarrhea.   They are on Pepcid, Zofran as needed but can vomit it up.  Most recent CBC July without anemia, leukocytosis.  Bm's prior were normal, rare bloating and loose stools, no specific foods.  They say 6 months ago started to lose weights rapidly, was 184 in Jan and now at 128.  They have had diarrhea twice a day, has occ blood in stool and mucus.  Has woken up due to diarrhea or needing to vomit.  Has AB bloating and a lot of gas.  Denies rectal pain.  They have had worsening GERD, has been to UC with antinausea and antireflux that was not  helpful.  They will vomit 3-4 x a week, worse in the morning and later in the day, yellow bile can be brownish red.  Has had up AB pain but feels like muscle pain after vomiting.  Has had fatigue in the day.  Had heart palpitations with dizziness if standing.  Denies rashes, changes in vision, joint pain.   Denies NSAIDS, ETOH, drug use.   Maternal aunt with UC, paternal GF with IBD, paternal GM with RA.   Has family history of breast, lung and colon cancer ( maternal GM)  Wt Readings from Last 3 Encounters:  09/10/22 130 lb 2 oz (59 kg)  07/28/22 128 lb (58.1 kg)  05/07/22 124 lb (56.2 kg)   CBC  05/07/2022  HGB 14.0 MCV 90.0 without evidence of anemia WBC 6.3 Platelets 321 Kidney function 05/07/2022  BUN 7 Cr 0.66  GFR >60  Potassium 3.9   LFTs 05/07/2022  AST 26 ALT 18 Alkphos 51 TBili 0.5 07/22/2021 LIPASE 24 05/07/2022 TSH 1.503   Laurie Ament "Tiffany [name]"  reports that Laurie "Tiffany [name]" has never smoked. Laurie [name] "Tiffany [name]" has never used smokeless tobacco. Laurie [name] "Tiffany [name]" reports that Laurie "Tiffany [name]" does not drink alcohol and does not use drugs.  Current Medications:   Current Outpatient Medications (Endocrine & Metabolic):    medroxyPROGESTERone (DEPO-PROVERA) 150 MG/ML injection, Inject 1 mL (150 mg total) into the muscle every 3 (three) months. (Patient not taking: Reported on 07/28/2022)   testosterone cypionate (DEPOTESTOSTERONE CYPIONATE) 200 MG/ML injection,  Inject 75 mg (0.375 mL) subcutaneously once weekly. (Patient not taking: Reported on 07/28/2022)     Current Outpatient Medications (Hematological):    Ferrous Sulfate (IRON PO), Take by mouth. (Patient not taking: Reported on 09/10/2022)  Current Outpatient Medications (Other):    famotidine (PEPCID) 20 MG tablet, Take 1 tablet (20 mg total) by mouth 2 (two) times daily.   hyoscyamine (LEVSIN SL) 0.125 MG SL tablet, Place 1 tablet (0.125 mg total) under the  tongue every 6 (six) hours as needed for cramping (nausea, diarrhea).   Multiple Vitamin (MULTI-VITAMIN DAILY PO), Take by mouth. (Patient not taking: Reported on 09/10/2022)   NEEDLE, DISP, 25 G 25G X 5/8" MISC, Use 1 needle weekly for subcutaneous testosterone injection (Patient not taking: Reported on 07/28/2022)   ondansetron (ZOFRAN-ODT) 8 MG disintegrating tablet, Take 1 tablet (8 mg total) by mouth every 8 (eight) hours as needed for nausea or vomiting.   Syringe/Needle, Disp, (B-D ECLIPSE SYRINGE) 27G X 1/2" 1 ML MISC, Use 1 syringe and needle weekly for testosterone injection (Patient not taking: Reported on 07/28/2022)  Medical History:  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/28/2018   Prediabetes 02/08/2016   Vitamin D deficiency 12/21/2014   Allergies: No Known Allergies   Surgical History:  Laurie Zehren "Tiffany [name]"  has no past surgical history on file. Family History:  Laurie [place] "[name]"'s family history includes Alcohol abuse in Laurie "[name]"'s maternal grandfather and maternal grandmother; Cancer in [place] "[name]"'s maternal grandmother; Diabetes in Laurie "[name]"'s maternal grandfather and paternal grandfather; Heart disease in Laurie "[name]"'s maternal grandfather and paternal grandfather; Hyperlipidemia in Laurie "[name]"'s maternal grandfather and paternal grandmother; Hypertension in [place] "[name]"'s maternal grandfather and paternal grandmother; Obesity in Laurie "[name]"'s paternal grandfather and paternal grandmother.  REVIEW OF SYSTEMS  : All other systems reviewed and negative except where noted in the History of Present Illness.  PHYSICAL EXAM: BP 118/68   Pulse 85   Ht _0  (1.549 m)   Wt 130 lb 2 oz (59 kg)   BMI 24.59 kg/m  General:   Pleasant, well developed adult in no acute distress Head:   Normocephalic and atraumatic. Eyes:   sclerae anicteric,conjunctive pink  Heart:   regular rate and rhythm Pulm:  Clear anteriorly; no wheezing Abdomen:   Soft, Flat AB, Active bowel sounds. mild- moderate tenderness in the lower abdomen. With guarding and Without rebound, No organomegaly appreciated. Rectal: Not evaluated Extremities:  Without edema. Msk: Symmetrical without gross deformities. Peripheral pulses intact.  Neurologic:  Alert and  oriented x4;  No focal deficits.  Skin:   Dry and intact without significant lesions or rashes. Psychiatric:  Cooperative. Normal mood and affect.    Vladimir Crofts, PA-C 2:49 PM

## 2022-09-10 ENCOUNTER — Encounter: Payer: Self-pay | Admitting: Physician Assistant

## 2022-09-10 ENCOUNTER — Other Ambulatory Visit (INDEPENDENT_AMBULATORY_CARE_PROVIDER_SITE_OTHER): Payer: Medicaid Other

## 2022-09-10 ENCOUNTER — Ambulatory Visit (INDEPENDENT_AMBULATORY_CARE_PROVIDER_SITE_OTHER): Payer: Medicaid Other | Admitting: Physician Assistant

## 2022-09-10 VITALS — BP 118/68 | HR 85 | Ht 61.0 in | Wt 130.1 lb

## 2022-09-10 DIAGNOSIS — R112 Nausea with vomiting, unspecified: Secondary | ICD-10-CM | POA: Diagnosis not present

## 2022-09-10 DIAGNOSIS — R197 Diarrhea, unspecified: Secondary | ICD-10-CM

## 2022-09-10 DIAGNOSIS — R634 Abnormal weight loss: Secondary | ICD-10-CM | POA: Diagnosis not present

## 2022-09-10 DIAGNOSIS — K625 Hemorrhage of anus and rectum: Secondary | ICD-10-CM

## 2022-09-10 LAB — COMPREHENSIVE METABOLIC PANEL
ALT: 11 U/L (ref 0–35)
AST: 16 U/L (ref 0–37)
Albumin: 4.8 g/dL (ref 3.5–5.2)
Alkaline Phosphatase: 47 U/L (ref 39–117)
BUN: 10 mg/dL (ref 6–23)
CO2: 29 mEq/L (ref 19–32)
Calcium: 9.8 mg/dL (ref 8.4–10.5)
Chloride: 104 mEq/L (ref 96–112)
Creatinine, Ser: 0.62 mg/dL (ref 0.40–1.20)
GFR: 127.73 mL/min (ref >60.00)
Glucose, Bld: 89 mg/dL (ref 70–99)
Potassium: 4.2 mEq/L (ref 3.5–5.1)
Sodium: 140 mEq/L (ref 135–145)
Total Bilirubin: 0.6 mg/dL (ref 0.2–1.2)
Total Protein: 7.5 g/dL (ref 6.0–8.3)

## 2022-09-10 LAB — CBC WITH DIFFERENTIAL/PLATELET
Basophils Absolute: 0 10*3/uL (ref 0.0–0.1)
Basophils Relative: 0.5 % (ref 0.0–3.0)
Eosinophils Absolute: 0.1 10*3/uL (ref 0.0–0.7)
Eosinophils Relative: 1.6 % (ref 0.0–5.0)
HCT: 44.7 % (ref 36.0–46.0)
Hemoglobin: 15.4 g/dL — ABNORMAL HIGH (ref 12.0–15.0)
Lymphocytes Relative: 29.2 % (ref 12.0–46.0)
Lymphs Abs: 2.6 10*3/uL (ref 0.7–4.0)
MCHC: 34.4 g/dL (ref 30.0–36.0)
MCV: 92.1 fl (ref 78.0–100.0)
Monocytes Absolute: 0.6 10*3/uL (ref 0.1–1.0)
Monocytes Relative: 7.1 % (ref 3.0–12.0)
Neutro Abs: 5.5 10*3/uL (ref 1.4–7.7)
Neutrophils Relative %: 61.6 % (ref 43.0–77.0)
Platelets: 287 10*3/uL (ref 150.0–400.0)
RBC: 4.85 Mil/uL (ref 3.87–5.11)
RDW: 13.4 % (ref 11.5–14.6)
WBC: 9 10*3/uL (ref 4.5–10.5)

## 2022-09-10 LAB — SEDIMENTATION RATE: Sed Rate: 8 mm/hr (ref 0–20)

## 2022-09-10 LAB — HIGH SENSITIVITY CRP: CRP, High Sensitivity: 0.5 mg/L (ref 0.000–5.000)

## 2022-09-10 LAB — TSH: TSH: 0.49 u[IU]/mL (ref 0.35–5.50)

## 2022-09-10 MED ORDER — ONDANSETRON 8 MG PO TBDP: 8.0000 mg | ORAL_TABLET | Freq: Three times a day (TID) | ORAL | 0 refills | Status: DC | PRN

## 2022-09-10 MED ORDER — NA SULFATE-K SULFATE-MG SULF 17.5-3.13-1.6 GM/177ML PO SOLN: 1.0000 | Freq: Once | ORAL | 0 refills | Status: AC

## 2022-09-10 MED ORDER — HYOSCYAMINE SULFATE 0.125 MG SL SUBL: 0.1250 mg | SUBLINGUAL_TABLET | Freq: Four times a day (QID) | SUBLINGUAL | 1 refills | Status: DC | PRN

## 2022-09-10 NOTE — Patient Instructions (Addendum)
Your provider has requested that you go to the basement level for lab work before leaving today. Press "B" on the elevator. The lab is located at the first door on the left as you exit the elevator.  You have been scheduled for an endoscopy and colonoscopy. Please follow the written instructions given to you at your visit today. Please pick up your prep supplies at the pharmacy within the next 1-3 days. If you use inhalers (even only as needed), please bring them with you on the day of your procedure.   We may want to evaluate you for small intestinal bacterial overgrowth, this can cause increase gas, bloating, loose stools or constipation.  There is a test for this we can do or sometimes we will treat a patient with an antibiotic to see if it helps.   First do a trial off milk/lactose products if you use them.  Add fiber like benefiber or citracel once a day Increase activity Can do trial of IBGard which is over the counter for AB pain- Take 1-2 capsules once a day for maintence or twice a day during a flare Can send in an anti spasm medication, Levsin, to take as needed Please try to decrease stress. consider talking with PCP about anti anxiety medication or try head space app for meditation. if any worsening symptoms like blood in stool, weight loss, please call the office   Please try low FODMAP diet- see below- start with eliminating just one column at a time, the table at the very bottom contains foods that are safe to take   FODMAP stands for fermentable oligo-, di-, mono-saccharides and polyols (1). These are the scientific terms used to classify groups of carbs that are notorious for triggering digestive symptoms like bloating, gas and stomach pain.

## 2022-09-11 ENCOUNTER — Other Ambulatory Visit: Payer: Medicaid Other

## 2022-09-11 DIAGNOSIS — R197 Diarrhea, unspecified: Secondary | ICD-10-CM | POA: Diagnosis not present

## 2022-09-11 DIAGNOSIS — R634 Abnormal weight loss: Secondary | ICD-10-CM

## 2022-09-11 DIAGNOSIS — R112 Nausea with vomiting, unspecified: Secondary | ICD-10-CM | POA: Diagnosis not present

## 2022-09-11 LAB — HIV ANTIBODY (ROUTINE TESTING W REFLEX): HIV 1&2 Ab, 4th Generation: NONREACTIVE

## 2022-09-11 LAB — TISSUE TRANSGLUTAMINASE, IGA: (tTG) Ab, IgA: <1 U/mL

## 2022-09-11 LAB — IGA: Immunoglobulin A: 165 mg/dL (ref 47–310)

## 2022-09-12 LAB — HELICOBACTER PYLORI  SPECIAL ANTIGEN
MICRO NUMBER:: 14252310
SPECIMEN QUALITY: ADEQUATE

## 2022-09-15 NOTE — Progress Notes (Signed)
____________________________________________________________  Attending physician addendum:  Thank you for sending this case to me. I have reviewed the entire note and agree with the plan.  Not withstanding the symptoms and need for endoscopic testing, lab work from this visit is reassuring without anemia, elevated inflammatory markers, or hypoalbuminemia. When I see them for procedures, I will also clarify if they are still taking hormone treatments and, if so, when those were started.  Amada Jupiter, MD  ____________________________________________________________

## 2022-09-16 LAB — GI PROFILE, STOOL, PCR

## 2022-09-16 LAB — CALPROTECTIN, FECAL: Calprotectin, Fecal: 19 ug/g (ref 0–120)

## 2022-09-25 ENCOUNTER — Encounter: Payer: Self-pay | Admitting: Gastroenterology

## 2022-10-26 ENCOUNTER — Encounter: Payer: Self-pay | Admitting: Gastroenterology

## 2022-10-27 ENCOUNTER — Encounter: Payer: Self-pay | Admitting: Gastroenterology

## 2022-10-27 ENCOUNTER — Ambulatory Visit (AMBULATORY_SURGERY_CENTER): Payer: Medicaid Other | Admitting: Gastroenterology

## 2022-10-27 VITALS — BP 99/52 | HR 61 | Temp 98.9°F | Resp 21 | Ht 61.0 in | Wt 130.0 lb

## 2022-10-27 DIAGNOSIS — R112 Nausea with vomiting, unspecified: Secondary | ICD-10-CM | POA: Diagnosis not present

## 2022-10-27 DIAGNOSIS — R197 Diarrhea, unspecified: Secondary | ICD-10-CM

## 2022-10-27 DIAGNOSIS — R7303 Prediabetes: Secondary | ICD-10-CM | POA: Diagnosis not present

## 2022-10-27 DIAGNOSIS — K319 Disease of stomach and duodenum, unspecified: Secondary | ICD-10-CM | POA: Diagnosis not present

## 2022-10-27 DIAGNOSIS — K625 Hemorrhage of anus and rectum: Secondary | ICD-10-CM | POA: Diagnosis not present

## 2022-10-27 DIAGNOSIS — R634 Abnormal weight loss: Secondary | ICD-10-CM | POA: Diagnosis not present

## 2022-10-27 HISTORY — PX: COLONOSCOPY WITH ESOPHAGOGASTRODUODENOSCOPY (EGD): SHX5779

## 2022-10-27 MED ORDER — SODIUM CHLORIDE 0.9 % IV SOLN: 500.0000 mL | Freq: Once | INTRAVENOUS | Status: DC

## 2022-10-27 NOTE — Progress Notes (Signed)
Pt's states no medical or surgical changes since previsit or office visit. 

## 2022-10-27 NOTE — Patient Instructions (Signed)
Discharge instructions given. Biopsies taken. Resume previous medications. YOU HAD AN ENDOSCOPIC PROCEDURE TODAY AT THE [facility]:   Refer to the procedure report that was given to you for any specific questions about what was found during the examination.  If the procedure report does not answer your questions, please call your gastroenterologist to clarify.  If you requested that your care partner not be given the details of your procedure findings, then the procedure report has been included in a sealed envelope for you to review at your convenience later.  YOU SHOULD EXPECT: Some feelings of bloating in the abdomen. Passage of more gas than usual.  Walking can help get rid of the air that was put into your GI tract during the procedure and reduce the bloating. If you had a lower endoscopy (such as a colonoscopy or flexible sigmoidoscopy) you may notice spotting of blood in your stool or on the toilet paper. If you underwent a bowel prep for your procedure, you may not have a normal bowel movement for a few days.  Please Note:  You might notice some irritation and congestion in your nose or some drainage.  This is from the oxygen used during your procedure.  There is no need for concern and it should clear up in a day or so.  SYMPTOMS TO REPORT IMMEDIATELY:  Following lower endoscopy (colonoscopy or flexible sigmoidoscopy):  Excessive amounts of blood in the stool  Significant tenderness or worsening of abdominal pains  Swelling of the abdomen that is new, acute  Fever of 100F or higher  Following upper endoscopy (EGD)  Vomiting of blood or coffee ground material  New chest pain or pain under the shoulder blades  Painful or persistently difficult swallowing  New shortness of breath  Fever of 100F or higher  Black, tarry-looking stools  For urgent or emergent issues, a gastroenterologist can be reached at any hour by calling [phone]. Do not use MyChart messaging  for urgent concerns.    DIET:  We do recommend a small meal at first, but then you may proceed to your regular diet.  Drink plenty of fluids but you should avoid alcoholic beverages for 24 hours.  ACTIVITY:  You should plan to take it easy for the rest of today and you should NOT DRIVE or use heavy machinery until tomorrow (because of the sedation medicines used during the test).    FOLLOW UP: Our staff will call the number listed on your records the next business day following your procedure.  We will call around 7:15- 8:00 am to check on you and address any questions or concerns that you may have regarding the information given to you following your procedure. If we do not reach you, we will leave a message.     If any biopsies were taken you will be contacted by phone or by letter within the next 1-3 weeks.  Please call us at [phone] if you have not heard about the biopsies in 3 weeks.    SIGNATURES/CONFIDENTIALITY: You and/or your care partner have signed paperwork which will be entered into your electronic medical record.  These signatures attest to the fact that that the information above on your After Visit Summary has been reviewed and is understood.  Full responsibility of the confidentiality of this discharge information lies with you and/or your care-partner. 

## 2022-10-27 NOTE — Op Note (Signed)
[facility] Patient Name: Laurie [name] Procedure Date: 10/27/2022 2:47 PM MRN: [id number] Endoscopist: Sherilyn Cooter L. Myrtie Neither , MD, [id number] Age: 22 Referring MD:  Date of Birth: 12/02/2000 Gender: Female Account #: [id number] Procedure:                Colonoscopy Indications:              Chronic diarrhea, Rectal bleeding, Weight loss                            (which has stabilized), Family history of                            inflammatory bowel disease                           Clinical details in office consult note 09/10/2022:                            Subsequent normal CBC, CMP, albumin, TTG antibody,                            ESR, CRP, negative GI pathogen panel and normal                            fecal calprotectin Medicines:                Monitored Anesthesia Care Procedure:                Pre-Anesthesia Assessment:                           - Prior to the procedure, a History and Physical                            was performed, and patient medications and                            allergies were reviewed. The patient's tolerance of                            previous anesthesia was also reviewed. The risks                            and benefits of the procedure and the sedation                            options and risks were discussed with the patient.                            All questions were answered, and informed consent                            was obtained. Prior Anticoagulants: The patient has  taken no anticoagulant or antiplatelet agents. ASA                            Grade Assessment: I - A normal, healthy patient.                            After reviewing the risks and benefits, the patient                            was deemed in satisfactory condition to undergo the                            procedure.                           After obtaining informed consent, the colonoscope                             was passed under direct vision. Throughout the                            procedure, the patient's blood pressure, pulse, and                            oxygen saturations were monitored continuously. The                            Olympus PCF-H190DL ([id number]) Colonoscope was                            introduced through the anus and advanced to the the                            terminal ileum, with identification of the                            appendiceal orifice and IC valve. The colonoscopy                            was performed without difficulty. The patient                            tolerated the procedure well. The quality of the                            bowel preparation was excellent. The terminal                            ileum, ileocecal valve, appendiceal orifice, and                            rectum were photographed. Scope In: 3:29:08 PM Scope Out: 3:37:54 PM Scope Withdrawal Time: 0 hours 6 minutes 37 seconds  Total Procedure Duration: 0 hours 8 minutes 46 seconds  Findings:                 The perianal and digital rectal examinations were                            normal.                           The terminal ileum appeared normal.                           Normal mucosa was found in the entire colon.                            Biopsies for histology were taken with a cold                            forceps from the ascending colon, transverse colon                            and sigmoid colon for evaluation of microscopic                            colitis.                           The entire examined colon appeared normal on direct                            and retroflexion views. Complications:            No immediate complications. Estimated Blood Loss:     Estimated blood loss was minimal. Impression:               - The examined portion of the ileum was normal.                           - Normal mucosa in the entire examined colon.                             Biopsied.                           - The entire examined colon is normal on direct and                            retroflexion views. Recommendation:           - Patient has a contact number available for                            emergencies. The signs and symptoms of potential                            delayed complications were discussed with the  patient. Return to normal activities tomorrow.                            Written discharge instructions were provided to the                            patient.                           - Resume previous diet.                           - Continue present medications.                           - Await pathology results.                           - No recommendation at this time regarding repeat                            colonoscopy.                           - See the other procedure note for documentation of                            additional recommendations. Vance Hochmuth L. Loletha Carrow, MD 10/27/2022 3:47:18 PM This report has been signed electronically.

## 2022-10-27 NOTE — Op Note (Signed)
[facility] Patient Name: Laurie [name] Procedure Date: 10/27/2022 2:54 PM MRN: [id number] Endoscopist: Mallie Mussel L. Loletha Carrow , MD, [id number] Age: 22 Referring MD:  Date of Birth: 2001-02-22 Gender: Female Account #: [id number] Procedure:                Upper GI endoscopy Indications:              Nausea with vomiting, Weight loss (which has                            stabilized) Medicines:                Monitored Anesthesia Care Procedure:                Pre-Anesthesia Assessment:                           - Prior to the procedure, a History and Physical                            was performed, and patient medications and                            allergies were reviewed. The patient's tolerance of                            previous anesthesia was also reviewed. The risks                            and benefits of the procedure and the sedation                            options and risks were discussed with the patient.                            All questions were answered, and informed consent                            was obtained. Prior Anticoagulants: The patient has                            taken no anticoagulant or antiplatelet agents. ASA                            Grade Assessment: I - A normal, healthy patient.                            After reviewing the risks and benefits, the patient                            was deemed in satisfactory condition to undergo the                            procedure.  After obtaining informed consent, the endoscope was                            passed under direct vision. Throughout the                            procedure, the patient's blood pressure, pulse, and                            oxygen saturations were monitored continuously. The                            GIF [id number] was introduced through the                            mouth, and advanced to the second part of duodenum.                             The upper GI endoscopy was accomplished without                            difficulty. The patient tolerated the procedure                            well. Scope In: Scope Out: Findings:                 The esophagus was normal.                           The entire examined stomach was normal. Several                            biopsies were obtained in the gastric body and in                            the gastric antrum with cold forceps for histology.                           The cardia and gastric fundus were normal on                            retroflexion.                           The examined duodenum was normal. Biopsies for                            histology were taken with a cold forceps for                            evaluation of celiac disease. Complications:            No immediate complications. Estimated Blood Loss:     Estimated blood loss was minimal. Impression:               -  Normal esophagus.                           - Normal stomach.                           - Normal examined duodenum. Biopsied.                           - Several biopsies were obtained in the gastric                            body and in the gastric antrum. Recommendation:           - Patient has a contact number available for                            emergencies. The signs and symptoms of potential                            delayed complications were discussed with the                            patient. Return to normal activities tomorrow.                            Written discharge instructions were provided to the                            patient.                           - Resume previous diet.                           - Continue present medications.                           - Await pathology results.                           - See the other procedure note for documentation of                            additional recommendations. Bassam Dresch L. Loletha Carrow,  MD 10/27/2022 3:43:53 PM This report has been signed electronically.

## 2022-10-27 NOTE — Progress Notes (Signed)
Pt resting comfortably. VSS. Airway intact. SBAR complete to RN. All questions answered.   

## 2022-10-27 NOTE — Progress Notes (Signed)
Called to room to assist during endoscopic procedure.  Patient ID and intended procedure confirmed with present staff. Received instructions for my participation in the procedure from the performing physician.

## 2022-10-27 NOTE — Progress Notes (Signed)
History and Physical:  This patient presents for endoscopic testing for: Encounter Diagnoses  Name Primary?   Diarrhea, unspecified type Yes   Nausea and vomiting, unspecified vomiting type    Loss of weight    Rectal bleeding     22 year old with above-noted symptoms, clinical details in office consult note of 09/10/2022. Subsequent that is normal including: CBC, CMP, TSH/free T4, CRP, ESR, TTG antibody  Patient is otherwise without complaints or active issues today.   Past Medical History: 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/28/2018   Prediabetes 02/08/2016   Vitamin D deficiency 12/21/2014     Past Surgical History: Past Surgical History:  Procedure Laterality Date   COLONOSCOPY WITH ESOPHAGOGASTRODUODENOSCOPY (EGD)  10/27/2022    Allergies: No Known Allergies  Outpatient Meds: Current Outpatient Medications  Medication Sig Dispense Refill   FLUoxetine (PROZAC) 10 MG tablet      famotidine (PEPCID) 20 MG tablet Take 1 tablet (20 mg total) by mouth 2 (two) times daily. 30 tablet 1   Ferrous Sulfate (IRON PO) Take by mouth. (Patient not taking: Reported on 09/10/2022)     hyoscyamine (LEVSIN SL) 0.125 MG SL tablet Place 1 tablet (0.125 mg total) under the tongue every 6 (six) hours as needed for cramping (nausea, diarrhea). 50 tablet 1   Multiple Vitamin (MULTI-VITAMIN DAILY PO) Take by mouth. (Patient not taking: Reported on 09/10/2022)     ondansetron (ZOFRAN-ODT) 8 MG disintegrating tablet Take 1 tablet (8 mg total) by mouth every 8 (eight) hours as needed for nausea or vomiting. 20 tablet 0   Current Facility-Administered Medications  Medication Dose Route Frequency Provider Last Rate Last Admin   0.9 %  sodium chloride infusion  500 mL Intravenous Once Doran Stabler, MD          ___________________________________________________________________ Objective   Exam:  BP 120/80   Pulse 82   Temp 98.9 F (37.2 C) (Temporal)    Resp 18   Ht 5\' 1"  (1.549 m)   Wt 130 lb (59 kg)   LMP 10/23/2022   SpO2 100%   BMI 24.56 kg/m   CV: regular , S1/S2 Resp: clear to auscultation bilaterally, normal RR and effort noted GI: soft, no tenderness, with active bowel sounds.   Assessment: Encounter Diagnoses  Name Primary?   Diarrhea, unspecified type Yes   Nausea and vomiting, unspecified vomiting type    Loss of weight    Rectal bleeding   Family history of inflammatory bowel disease   Plan: Colonoscopy EGD   The patient is appropriate for an endoscopic procedure in the ambulatory setting.   - Wilfrid Lund, MD

## 2022-10-28 ENCOUNTER — Telehealth: Payer: Self-pay

## 2022-10-28 NOTE — Telephone Encounter (Signed)
Left message on follow up call. 

## 2022-10-29 ENCOUNTER — Ambulatory Visit (INDEPENDENT_AMBULATORY_CARE_PROVIDER_SITE_OTHER): Payer: Medicaid Other | Admitting: Family Medicine

## 2022-10-29 ENCOUNTER — Encounter: Payer: Self-pay | Admitting: Family Medicine

## 2022-10-29 VITALS — BP 105/73 | HR 79 | Temp 98.1°F | Resp 16 | Wt 133.0 lb

## 2022-10-29 DIAGNOSIS — R634 Abnormal weight loss: Secondary | ICD-10-CM | POA: Diagnosis not present

## 2022-10-29 DIAGNOSIS — R197 Diarrhea, unspecified: Secondary | ICD-10-CM | POA: Diagnosis not present

## 2022-10-29 NOTE — Progress Notes (Signed)
Patient is here for their 3 month follow-up Patient has no concerns today Care gaps have been discussed with patient  

## 2022-10-31 ENCOUNTER — Encounter: Payer: Self-pay | Admitting: Family Medicine

## 2022-10-31 NOTE — Progress Notes (Signed)
Established Patient Office Visit  Subjective    Patient ID: Laurie [name], adult    DOB: 05-21-2001  Age: 22 y.o. MRN: [id number]  CC:  Chief Complaint  Patient presents with   Follow-up    HPI Channel Collings presents for follow up of GI sx including weight loss and bloody diarrhea. Patient reports some minimal-moderate improvement of sx.    Outpatient Encounter Medications as of 10/29/2022  Medication Sig   famotidine (PEPCID) 20 MG tablet Take 1 tablet (20 mg total) by mouth 2 (two) times daily.   FLUoxetine (PROZAC) 10 MG tablet    hyoscyamine (LEVSIN SL) 0.125 MG SL tablet Place 1 tablet (0.125 mg total) under the tongue every 6 (six) hours as needed for cramping (nausea, diarrhea).   ondansetron (ZOFRAN-ODT) 8 MG disintegrating tablet Take 1 tablet (8 mg total) by mouth every 8 (eight) hours as needed for nausea or vomiting.   Ferrous Sulfate (IRON PO) Take by mouth. (Patient not taking: Reported on 09/10/2022)   Multiple Vitamin (MULTI-VITAMIN DAILY PO) Take by mouth. (Patient not taking: Reported on 09/10/2022)   No facility-administered encounter medications on file as of 10/29/2022.    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/28/2018   Prediabetes 02/08/2016   Vitamin D deficiency 12/21/2014    Past Surgical History:  Procedure Laterality Date   COLONOSCOPY WITH ESOPHAGOGASTRODUODENOSCOPY (EGD)  10/27/2022    Family History  Problem Relation Age of Onset   Colon cancer Maternal Grandmother    Cancer Maternal Grandmother    Alcohol abuse Maternal Grandmother    Diabetes Maternal Grandfather    Heart disease Maternal Grandfather    Hyperlipidemia Maternal Grandfather    Hypertension Maternal Grandfather    Alcohol abuse Maternal Grandfather    Obesity Paternal Grandmother    Hyperlipidemia Paternal Grandmother    Hypertension Paternal Grandmother    Obesity Paternal Grandfather    Heart disease Paternal Grandfather        A  fib related to history of rheumatic heart disease   Diabetes Paternal Grandfather    Asthma Neg Hx    Esophageal cancer Neg Hx    Stomach cancer Neg Hx    Rectal cancer Neg Hx     Social History   Socioeconomic History   Marital status: Single    Spouse name: Not on file   Number of children: Not on file   Years of education: Not on file   Highest education level: Not on file  Occupational History   Not on file  Tobacco Use   Smoking status: Never   Smokeless tobacco: Never  Vaping Use   Vaping Use: Former   Quit date: 10/01/2022   Substances: Nicotine  Substance and Sexual Activity   Alcohol use: Never   Drug use: Never   Sexual activity: Not on file  Other Topics Concern   Not on file  Social History Narrative   Not on file   Social Determinants of Health   Financial Resource Strain: Not on file  Food Insecurity: Not on file  Transportation Needs: Not on file  Physical Activity: Not on file  Stress: Not on file  Social Connections: Not on file  Intimate Partner Violence: Not on file    Review of Systems  Constitutional:  Positive for weight loss.  Gastrointestinal:  Positive for abdominal pain and diarrhea.  All other systems reviewed and are negative.       Objective  BP 105/73   Pulse 79   Temp 98.1 F (36.7 C) (Oral)   Resp 16   Wt 133 lb (60.3 kg)   LMP 10/23/2022   SpO2 98%   BMI 25.13 kg/m   Physical Exam Vitals and nursing note reviewed.  Constitutional:      General: Laurie [name] Media "Laurie [name]" is not in acute distress. Cardiovascular:     Rate and Rhythm: Normal rate and regular rhythm.  Pulmonary:     Effort: Pulmonary effort is normal.     Breath sounds: Normal breath sounds.  Abdominal:     Palpations: Abdomen is soft.     Tenderness: There is no abdominal tenderness.  Neurological:     General: No focal deficit present.     Mental Status: Laurie [name] "Laurie [name]" is alert and oriented to person, place, and time.          Assessment & Plan:   1. Bloody diarrhea Improvement. Present management per consultant  2. Loss of weight Appears to have stabilized. Management as above  Return in about 3 months (around 01/28/2023) for physical.   Becky Sax, MD

## 2023-02-02 ENCOUNTER — Encounter: Payer: Medicaid Other | Admitting: Family Medicine

## 2023-04-15 ENCOUNTER — Ambulatory Visit
Admission: RE | Admit: 2023-04-15 | Discharge: 2023-04-15 | Disposition: A | Discharge status: Home / Self Care | Payer: Medicaid Other | Source: Ambulatory Visit | Attending: Internal Medicine | Admitting: Internal Medicine

## 2023-04-15 VITALS — BP 104/69 | HR 89 | Temp 99.9°F | Resp 18

## 2023-04-15 DIAGNOSIS — J029 Acute pharyngitis, unspecified: Secondary | ICD-10-CM | POA: Diagnosis not present

## 2023-04-15 DIAGNOSIS — Z20822 Contact with and (suspected) exposure to covid-19: Secondary | ICD-10-CM

## 2023-04-15 DIAGNOSIS — J069 Acute upper respiratory infection, unspecified: Secondary | ICD-10-CM

## 2023-04-15 LAB — POCT RAPID STREP A (OFFICE): Rapid Strep A Screen: NEGATIVE

## 2023-04-15 NOTE — ED Triage Notes (Signed)
Pt c/o cough, congestion, scratchy throat, and low grade fever x2days. States had COVID exposure a week ago and 3 days ago. States had a negative home COVID test. States took nyquil and dayquil with little relief. States needs a work note.

## 2023-04-15 NOTE — Discharge Instructions (Signed)
Rapid strep was negative.  Throat culture and COVID test pending.  As we discussed, your symptoms appear viral and should run its course.  Follow-up if any symptoms persist or worsen.

## 2023-04-15 NOTE — ED Provider Notes (Signed)
[facility]    CSN: [id number] Arrival date & time: 04/15/23  1829      History   Chief Complaint Chief Complaint  Patient presents with   Sore Throat    I have a sore throat, sinus congestion, a slight cough, and a low grade fever. I have done an at home Covid test and it's negative but I want to make sure I'm not sick with Covid as I have been exposed. - Entered by patient   Cough    HPI Laurie [name] is a 22 y.o. adult.   Patient presents with 2-day history of cough, nasal congestion, sore/scratchy throat, fever.  Tmax at home was 99.3.  Reports COVID exposure.  Although, she did take a COVID test at home that was negative.  Has taken DayQuil and NyQuil with some improvement of symptoms.  Denies history of asthma.  Denies chest pain or shortness of breath.   Sore Throat  Cough   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/28/2018   Prediabetes 02/08/2016   Vitamin D deficiency 12/21/2014    Patient Active Problem List   Diagnosis Date Noted   Endocrine disorder, unspecified 11/21/2019   Menstrual cramps 02/08/2016   Frequent headaches 12/21/2014   BMI (body mass index), pediatric, greater than or equal to 95% for age 82/07/2015    Past Surgical History:  Procedure Laterality Date   COLONOSCOPY WITH ESOPHAGOGASTRODUODENOSCOPY (EGD)  10/27/2022    OB History   No obstetric history on file.      Home Medications    Prior to Admission medications   Medication Sig Start Date End Date Taking? Authorizing Provider  famotidine (PEPCID) 20 MG tablet Take 1 tablet (20 mg total) by mouth 2 (two) times daily. 05/07/22   Ellsworth Lennox, PA-C  Ferrous Sulfate (IRON PO) Take by mouth. Patient not taking: Reported on 09/10/2022    [provider]  FLUoxetine (PROZAC) 10 MG tablet  09/26/22   [provider]  hyoscyamine (LEVSIN SL) 0.125 MG SL tablet Place 1 tablet (0.125 mg total) under the tongue every 6 (six)  hours as needed for cramping (nausea, diarrhea). 09/10/22   Doree Albee, PA-C  Multiple Vitamin (MULTI-VITAMIN DAILY PO) Take by mouth. Patient not taking: Reported on 09/10/2022    [provider]  ondansetron (ZOFRAN-ODT) 8 MG disintegrating tablet Take 1 tablet (8 mg total) by mouth every 8 (eight) hours as needed for nausea or vomiting. 09/10/22   Doree Albee, PA-C    Family History Family History  Problem Relation Age of Onset   Colon cancer Maternal Grandmother    Cancer Maternal Grandmother    Alcohol abuse Maternal Grandmother    Diabetes Maternal Grandfather    Heart disease Maternal Grandfather    Hyperlipidemia Maternal Grandfather    Hypertension Maternal Grandfather    Alcohol abuse Maternal Grandfather    Obesity Paternal Grandmother    Hyperlipidemia Paternal Grandmother    Hypertension Paternal Grandmother    Obesity Paternal Grandfather    Heart disease Paternal Grandfather        A fib related to history of rheumatic heart disease   Diabetes Paternal Grandfather    Asthma Neg Hx    Esophageal cancer Neg Hx    Stomach cancer Neg Hx    Rectal cancer Neg Hx     Social History Social History   Tobacco Use   Smoking status: Never   Smokeless tobacco: Never  Vaping Use   Vaping Use: Former   Quit date: 10/01/2022   Substances: Nicotine  Substance Use Topics   Alcohol use: Never   Drug use: Never     Allergies   Patient has no known allergies.   Review of Systems Review of Systems Per HPI  Physical Exam Triage Vital Signs ED Triage Vitals  Enc Vitals Group     BP 04/15/23 1857 104/69     Pulse Rate 04/15/23 1857 89     Resp 04/15/23 1857 18     Temp 04/15/23 1857 99.9 F (37.7 C)     Temp Source 04/15/23 1857 Oral     SpO2 04/15/23 1857 97 %     Weight --      Height --      Head Circumference --      Peak Flow --      Pain Score 04/15/23 1858 4     Pain Loc --      Pain Edu? --      Excl. in GC? --    No data  found.  Updated Vital Signs BP 104/69 (BP Location: Left Arm)   Pulse 89   Temp 99.9 F (37.7 C) (Oral)   Resp 18   LMP 04/11/2023   SpO2 97%   Visual Acuity Right Eye Distance:   Left Eye Distance:   Bilateral Distance:    Right Eye Near:   Left Eye Near:    Bilateral Near:     Physical Exam Constitutional:      General: Laurie Bazzle "[name]" is not in acute distress.    Appearance: Normal appearance. Laurie Kallie Locks "[name]" is not toxic-appearing or diaphoretic.  HENT:     Head: Normocephalic and atraumatic.     Right Ear: Tympanic membrane and ear canal normal.     Left Ear: Tympanic membrane and ear canal normal.     Nose: Congestion present.     Mouth/Throat:     Mouth: Mucous membranes are moist.     Pharynx: Posterior oropharyngeal erythema present.  Eyes:     Extraocular Movements: Extraocular movements intact.     Conjunctiva/sclera: Conjunctivae normal.     Pupils: Pupils are equal, round, and reactive to light.  Cardiovascular:     Rate and Rhythm: Normal rate and regular rhythm.     Pulses: Normal pulses.     Heart sounds: Normal heart sounds.  Pulmonary:     Effort: Pulmonary effort is normal. No respiratory distress.     Breath sounds: Normal breath sounds. No wheezing.  Abdominal:     General: Abdomen is flat. Bowel sounds are normal.     Palpations: Abdomen is soft.  Musculoskeletal:        General: Normal range of motion.     Cervical back: Normal range of motion.  Skin:    General: Skin is warm.  Neurological:     General: No focal deficit present.     Mental Status: Laurie Ferch "[name]" is alert and oriented to person, place, and time. Mental status is at baseline.  Psychiatric:        Mood and Affect: Mood normal.        Behavior: Behavior normal.      UC Treatments / Results  Labs (all labs ordered are listed, but only abnormal results are displayed) Labs Reviewed  CULTURE, GROUP A STREP ([facility])  SARS CORONAVIRUS 2  (TAT 6-24 HRS)  POCT RAPID STREP A (OFFICE)  EKG   Radiology No results found.  Procedures Procedures (including critical care time)  Medications Ordered in UC Medications - No data to display  Initial Impression / Assessment and Plan / UC Course  I have reviewed the triage vital signs and the nursing notes.  Pertinent labs & imaging results that were available during my care of the patient were reviewed by me and considered in my medical decision making (see chart for details).     Patient presents with symptoms likely from a viral upper respiratory infection.  Do not suspect underlying cardiopulmonary process. Symptoms seem unlikely related to ACS, CHF or COPD exacerbations, pneumonia, pneumothorax. Patient is nontoxic appearing and not in need of emergent medical intervention.  Rapid strep is negative.  Throat culture and COVID test pending.  Recommended symptom control with over the counter medications.  Return if symptoms fail to improve in 1-2 weeks or you develop shortness of breath, chest pain, severe headache. Patient states understanding and is agreeable.  Discharged with PCP followup.  Final Clinical Impressions(s) / UC Diagnoses   Final diagnoses:  Viral upper respiratory tract infection with cough  Sore throat  Close exposure to COVID-19 virus     Discharge Instructions      Rapid strep was negative.  Throat culture and COVID test pending.  As we discussed, your symptoms appear viral and should run its course.  Follow-up if any symptoms persist or worsen.    ED Prescriptions   None    PDMP not reviewed this encounter.   Gustavus Bryant, [place] 04/15/23 1921

## 2023-04-16 LAB — SARS CORONAVIRUS 2 (TAT 6-24 HRS): SARS Coronavirus 2: NEGATIVE

## 2023-04-18 LAB — CULTURE, GROUP A STREP (THRC)

## 2023-08-05 ENCOUNTER — Ambulatory Visit: Payer: Medicaid Other

## 2023-11-02 ENCOUNTER — Emergency Department (HOSPITAL_COMMUNITY): Payer: Medicaid Other

## 2023-11-02 ENCOUNTER — Other Ambulatory Visit: Payer: Self-pay

## 2023-11-02 ENCOUNTER — Emergency Department (HOSPITAL_COMMUNITY)
Admission: EM | Admit: 2023-11-02 | Discharge: 2023-11-02 | Disposition: A | Discharge status: Home / Self Care | Payer: Medicaid Other | Attending: Emergency Medicine | Admitting: Emergency Medicine

## 2023-11-02 ENCOUNTER — Encounter (HOSPITAL_COMMUNITY): Payer: Self-pay | Admitting: Emergency Medicine

## 2023-11-02 DIAGNOSIS — R000 Tachycardia, unspecified: Secondary | ICD-10-CM | POA: Diagnosis not present

## 2023-11-02 DIAGNOSIS — Z87891 Personal history of nicotine dependence: Secondary | ICD-10-CM | POA: Insufficient documentation

## 2023-11-02 DIAGNOSIS — D72829 Elevated white blood cell count, unspecified: Secondary | ICD-10-CM | POA: Insufficient documentation

## 2023-11-02 DIAGNOSIS — W19XXXA Unspecified fall, initial encounter: Secondary | ICD-10-CM | POA: Diagnosis not present

## 2023-11-02 DIAGNOSIS — R0689 Other abnormalities of breathing: Secondary | ICD-10-CM | POA: Diagnosis not present

## 2023-11-02 DIAGNOSIS — R600 Localized edema: Secondary | ICD-10-CM | POA: Diagnosis not present

## 2023-11-02 DIAGNOSIS — S82451A Displaced comminuted fracture of shaft of right fibula, initial encounter for closed fracture: Secondary | ICD-10-CM | POA: Diagnosis not present

## 2023-11-02 DIAGNOSIS — Y9331 Activity, mountain climbing, rock climbing and wall climbing: Secondary | ICD-10-CM | POA: Insufficient documentation

## 2023-11-02 DIAGNOSIS — W010XXA Fall on same level from slipping, tripping and stumbling without subsequent striking against object, initial encounter: Secondary | ICD-10-CM | POA: Insufficient documentation

## 2023-11-02 DIAGNOSIS — S82301A Unspecified fracture of lower end of right tibia, initial encounter for closed fracture: Secondary | ICD-10-CM | POA: Diagnosis not present

## 2023-11-02 DIAGNOSIS — S82421A Displaced transverse fracture of shaft of right fibula, initial encounter for closed fracture: Secondary | ICD-10-CM | POA: Diagnosis not present

## 2023-11-02 DIAGNOSIS — M25579 Pain in unspecified ankle and joints of unspecified foot: Secondary | ICD-10-CM | POA: Diagnosis not present

## 2023-11-02 DIAGNOSIS — S82851A Displaced trimalleolar fracture of right lower leg, initial encounter for closed fracture: Secondary | ICD-10-CM | POA: Diagnosis not present

## 2023-11-02 DIAGNOSIS — S8991XA Unspecified injury of right lower leg, initial encounter: Secondary | ICD-10-CM | POA: Diagnosis present

## 2023-11-02 DIAGNOSIS — I10 Essential (primary) hypertension: Secondary | ICD-10-CM | POA: Diagnosis not present

## 2023-11-02 LAB — CBC WITH DIFFERENTIAL/PLATELET
Abs Immature Granulocytes: 0.03 10*3/uL (ref 0.00–0.07)
Basophils Absolute: 0 10*3/uL (ref 0.0–0.1)
Basophils Relative: 0 %
Eosinophils Absolute: 0.1 10*3/uL (ref 0.0–0.5)
Eosinophils Relative: 1 %
HCT: 43.4 % (ref 36.0–46.0)
Hemoglobin: 15.2 g/dL — ABNORMAL HIGH (ref 12.0–15.0)
Immature Granulocytes: 0 %
Lymphocytes Relative: 33 %
Lymphs Abs: 3.7 10*3/uL (ref 0.7–4.0)
MCH: 32.1 pg (ref 26.0–34.0)
MCHC: 35 g/dL (ref 30.0–36.0)
MCV: 91.6 fL (ref 80.0–100.0)
Monocytes Absolute: 0.9 10*3/uL (ref 0.1–1.0)
Monocytes Relative: 8 %
Neutro Abs: 6.5 10*3/uL (ref 1.7–7.7)
Neutrophils Relative %: 58 %
Platelets: 345 10*3/uL (ref 150–400)
RBC: 4.74 MIL/uL (ref 3.87–5.11)
RDW: 12.7 % (ref 11.5–15.5)
WBC: 11.3 10*3/uL — ABNORMAL HIGH (ref 4.0–10.5)
nRBC: 0 % (ref 0.0–0.2)

## 2023-11-02 LAB — I-STAT CHEM 8, ED
BUN: 5 mg/dL — ABNORMAL LOW (ref 6–20)
Calcium, Ion: 1.1 mmol/L — ABNORMAL LOW (ref 1.15–1.40)
Chloride: 105 mmol/L (ref 98–111)
Creatinine, Ser: 0.7 mg/dL (ref 0.44–1.00)
Glucose, Bld: 127 mg/dL — ABNORMAL HIGH (ref 70–99)
HCT: 44 % (ref 36.0–46.0)
Hemoglobin: 15 g/dL (ref 12.0–15.0)
Potassium: 3.6 mmol/L (ref 3.5–5.1)
Sodium: 139 mmol/L (ref 135–145)
TCO2: 20 mmol/L — ABNORMAL LOW (ref 22–32)

## 2023-11-02 LAB — BASIC METABOLIC PANEL
Anion gap: 11 (ref 5–15)
BUN: 6 mg/dL (ref 6–20)
CO2: 21 mmol/L — ABNORMAL LOW (ref 22–32)
Calcium: 9.2 mg/dL (ref 8.9–10.3)
Chloride: 105 mmol/L (ref 98–111)
Creatinine, Ser: 0.73 mg/dL (ref 0.44–1.00)
GFR, Estimated: >60 mL/min (ref >60)
Glucose, Bld: 127 mg/dL — ABNORMAL HIGH (ref 70–99)
Potassium: 3.6 mmol/L (ref 3.5–5.1)
Sodium: 137 mmol/L (ref 135–145)

## 2023-11-02 LAB — I-STAT CG4 LACTIC ACID, ED: Lactic Acid, Venous: 2.8 mmol/L (ref 0.5–1.9)

## 2023-11-02 MED ORDER — HYDROCODONE-ACETAMINOPHEN 5-325 MG PO TABS: 1.0000 | ORAL_TABLET | Freq: Three times a day (TID) | ORAL | 0 refills | Status: AC | PRN

## 2023-11-02 MED ORDER — LIDOCAINE HCL (PF) 1 % IJ SOLN
INTRAMUSCULAR | Status: AC
  Administered 2023-11-02: 30 mL
  Filled 2023-11-02: qty 30

## 2023-11-02 MED ORDER — OXYCODONE HCL 5 MG PO TABS
10.0000 mg | ORAL_TABLET | Freq: Once | ORAL | Status: AC
  Administered 2023-11-02: 10 mg via ORAL
  Filled 2023-11-02: qty 2

## 2023-11-02 MED ORDER — HYDROMORPHONE HCL 1 MG/ML IJ SOLN
INTRAMUSCULAR | Status: AC
  Filled 2023-11-02: qty 1

## 2023-11-02 MED ORDER — HYDROCODONE-ACETAMINOPHEN 5-325 MG PO TABS
1.0000 | ORAL_TABLET | Freq: Once | ORAL | Status: AC
  Administered 2023-11-02: 1 via ORAL
  Filled 2023-11-02: qty 1

## 2023-11-02 MED ORDER — HYDROMORPHONE HCL 1 MG/ML IJ SOLN
1.0000 mg | Freq: Once | INTRAMUSCULAR | Status: AC
  Administered 2023-11-02: 1 mg via INTRAVENOUS

## 2023-11-02 MED ORDER — HYDROCODONE-ACETAMINOPHEN 5-325 MG PO TABS: 1.0000 | ORAL_TABLET | Freq: Three times a day (TID) | ORAL | 0 refills | Status: DC | PRN

## 2023-11-02 MED ORDER — HYDROMORPHONE HCL 1 MG/ML IJ SOLN
0.5000 mg | Freq: Once | INTRAMUSCULAR | Status: AC
  Administered 2023-11-02: 0.5 mg via INTRAVENOUS
  Filled 2023-11-02: qty 1

## 2023-11-02 MED ORDER — LIDOCAINE HCL (PF) 1 % IJ SOLN: 30.0000 mL | Freq: Once | INTRAMUSCULAR | Status: AC

## 2023-11-02 NOTE — TOC CAGE-AID Note (Signed)
Transition of Care [facility]) - CAGE-AID Screening   Patient Details  Name: Laurie [name] MRN: [id number] Date of Birth: September 17, 2001  Transition of Care [facility]) CM/SW Contact:    Janora Norlander, RN Phone Number: [phone] 11/02/2023, 4:15 PM   Clinical Narrative: Pt in the ED after sustaining a R ankle injury due to a fall from a rock climbing wall.  Pt states that she occasionally drinks alcohol and smokes weed.  Pt also smokes cigarettes.  Pt does not wish to have resources at this time.  Screening complete.    CAGE-AID Screening:    Have You Ever Felt You Ought to Cut Down on Your Drinking or Drug Use?: No Have People Annoyed You By Critizing Your Drinking Or Drug Use?: No Have You Felt Bad Or Guilty About Your Drinking Or Drug Use?: No Have You Ever Had a Drink or Used Drugs First Thing In The Morning to Steady Your Nerves or to Get Rid of a Hangover?: No CAGE-AID Score: 0  Substance Abuse Education Offered: No

## 2023-11-02 NOTE — ED Provider Notes (Incomplete)
  [name] Provider Note  MDM   HPI/ROS:  Laurie [name] is a 23 y.o. adult with pertinent past medical history of *** who presents ***  Partial dictation added by Mikeal Hawthorne, MD. Open the Dictate Later In Basket folder in [place] for [employer] to finish this dictation.   Physical exam is notable for: -***  On my initial evaluation, patient is:  -Vital signs stable.*** Patient afebrile***, hemodynamically stable***, and non-toxic appearing.*** -Additional history obtained from ***  This patient's current presentation, including their history and physical exam, is most consistent with ***. Differentials include ***.     Interpretations, interventions, and the patient's course of care are documented below.      ***   Disposition:  {ED Dispo:29898}  Clinical Impression: No diagnosis found.  Rx / DC Orders ED Discharge Orders     None       The plan for this patient was discussed with Dr. ***, who voiced agreement and who oversaw evaluation and treatment of this patient.   Clinical Complexity A medically appropriate history, review of systems, and physical exam was performed.  My independent interpretations of EKG, labs, and radiology are documented in the ED course above.   If decision rules were used in this patient's evaluation, they are listed below.  *** Click here for ABCD2, HEART and other calculatorsREFRESH Note before signing   Patient's presentation is most consistent with {EM COPA:27473}  Medical Decision Making Amount and/or Complexity of Data Reviewed Radiology: ordered.  Risk Prescription drug management.    HPI/ROS      See MDM section for pertinent HPI and ROS. A complete ROS was performed with pertinent positives/negatives noted above.   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/28/2018   Prediabetes 02/08/2016   Vitamin D deficiency 12/21/2014    Past Surgical  History:  Procedure Laterality Date   COLONOSCOPY WITH ESOPHAGOGASTRODUODENOSCOPY (EGD)  10/27/2022      Physical Exam   Vitals:   11/02/23 1455 11/02/23 1456 11/02/23 1500 11/02/23 1504  BP:  (!) 146/90 (!) 153/95   Pulse:   (!) 125   Resp:   (!) 21   Temp:    98.8 F (37.1 C)  SpO2:   100%   Weight: 70.3 kg     Height: 5\' 1"  (1.549 m)       Physical Exam Gen: NAD. Appears comfortable HENT: Conjunctiva clear, PERRL, EOMI. MMM.  CV: RRR. No M/R/G Pulm: Lungs CTAB with no wheezing, rales, or rhonchi.  GI: Abdomen soft, non-tender, non-distended. Normal bowel sounds in all 4 quadrants. MSK/Skin: No lower extremity edema. Extremities warm, well-perfused with 2+ pulses in all 4 extremities. Neuro: A&Ox3. GCS 15. Moves all extremities.     Procedures   If procedures were preformed on this patient, they are listed below:  Procedures   Mikeal Hawthorne, MD Emergency Medicine PGY-2   Please note that this documentation was produced with the assistance of voice-to-text technology and may contain errors.

## 2023-11-02 NOTE — ED Triage Notes (Addendum)
Pt BIB [facility] - pt was rock climbing 25ft w/o hardness, fell directly on R ankle. PMS intact. Denies head/neck/back pain. Denies LOC. C collar in place by EMS.  200 mcg fentanyl given by EMS 144/80 130 HR 100% RA

## 2023-11-02 NOTE — Discharge Instructions (Addendum)
You were seen in the emergency department after a fall.  You are found to have a right ankle fracture dislocation.  We reduced this fracture at bedside and placed you in a cast.  You should call Dr. Susa Simmonds with orthopedics first thing tomorrow to arrange close follow-up and surgery.  You should be nonweightbearing on your right leg until this time and please make sure to keep the cast dry. You can take Tylenol and ibuprofen as needed for pain.  We are additionally sending in a prescription for stronger pain medication that you can take for breakthrough pain. If you experience significantly increased pain despite these medications new numbness tingling or weakness, or any other concerns, return to the ED for reevaluation.

## 2023-11-02 NOTE — ED Notes (Addendum)
Right ankle reduced by Dr. Doran Durand and Dr. Rolland Bimler

## 2023-11-02 NOTE — ED Notes (Signed)
Patient transported to CT 

## 2023-11-02 NOTE — ED Provider Notes (Signed)
Laurie [name] Provider Note  MDM   HPI/ROS:  Laurie [name] is a 23 y.o. adult with pertinent past medical history of tobacco use who presents as an activated level 2 trauma following fall while rockclimbing. Patient reports she was about 15 feet up on a climbing wall when she lost her footing, falling down and landing on her right lower extremity.  She reports immediate onset of pain and deformity.  She reports some numbness in the toes of that foot the reports sensation intact.  She did not hit her head or lose consciousness.  She denies any anticoagulant use.  Only medication is over-the-counter birth control pills.    Physical exam is notable for: - Obvious deformity of the right ankle  -- 2+ DP pulse -- Able to wiggle toes of right foot -- Sensation intact to light touch, though with some subjective numbness  On my initial evaluation, patient is:  -Vital signs stable.  ABCs intact, GCS 15.  Patient afebrile, hemodynamically stable, and non-toxic appearing. -Additional history obtained from EMS  Based on patient's history, exam, and presentation, greatest concern for fracture-dislocation of the right ankle, which was confirmed on x-ray.  She has no other signs of trauma and cervical collar was clinically cleared at bedside.  No indication for other advanced imaging.  Joint was reduced at bedside after receiving systemic analgesia with Dilaudid and performing hematoma block.  Splint placed and postreduction films with improved alignment.  Orthopedic surgery consultation spoke with Dr. Hulda Humphrey who after reviewing postreduction films feel patient is appropriate for close outpatient follow-up and surgical intervention.  CT ankle was performed while in the ED to aid with surgical planning.  Labs additionally obtained and with mild leukocytosis, mildly elevated lactic acid otherwise unremarkable.    Patient received crutches and was discharged without  difficulty.  She remained stable and had no acute events while under my care in the emergency department.   Disposition:  I discussed the plan for discharge with the patient and/or their surrogate at bedside prior to discharge and they were in agreement with the plan and verbalized understanding of the return precautions provided. All questions answered to the best of my ability. Ultimately, the patient was discharged in stable condition with stable vital signs. I am reassured that they are capable of close follow up and good social support at home.   Clinical Impression: No diagnosis found.  Rx / DC Orders ED Discharge Orders     None       The plan for this patient was discussed with Dr. Doran Durand, who voiced agreement and who oversaw evaluation and treatment of this patient.   Clinical Complexity A medically appropriate history, review of systems, and physical exam was performed.  My independent interpretations of EKG, labs, and radiology are documented in the ED course above.   If decision rules were used in this patient's evaluation, they are listed below.   Patient's presentation is most consistent with acute presentation with potential threat to life or bodily function.  Medical Decision Making Amount and/or Complexity of Data Reviewed Labs: ordered. Radiology: ordered.  Risk Prescription drug management.    HPI/ROS      See MDM section for pertinent HPI and ROS. A complete ROS was performed with pertinent positives/negatives noted above.   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/28/2018   Prediabetes 02/08/2016   Vitamin D deficiency 12/21/2014    Past Surgical History:  Procedure Laterality Date   COLONOSCOPY WITH ESOPHAGOGASTRODUODENOSCOPY (EGD)  10/27/2022      Physical Exam   ED Triage Vitals  Encounter Vitals Group     BP 11/02/23 1456 (!) 146/90     Systolic BP Percentile --      Diastolic BP Percentile --      Pulse  Rate 11/02/23 1500 (!) 125     Resp 11/02/23 1500 (!) 21     Temp 11/02/23 1504 98.8 F (37.1 C)     Temp Source 11/02/23 1820 Oral     SpO2 11/02/23 1500 100 %     Weight 11/02/23 1455 155 lb (70.3 kg)     Height 11/02/23 1455 5\' 1"  (1.549 m)     Head Circumference --      Peak Flow --      Pain Score 11/02/23 1501 4     Pain Loc --      Pain Education --      Exclude from Growth Chart --     Physical Exam Gen: Alert Head: No skull depressions or lacerations.  ENT: Pupils 3 mm equal, round, reactive to light. No conjunctival hemorrhage. No periorbital ecchymoses/racoon eyes or Battle sign bilaterally. Ears atraumatic. No hemotympanum. No nasal septal deviation or hematoma. Mouth and tongue atraumatic. Trachea midline.  Chest: Clavicles atraumatic, stable to anterior compression without crepitus. Chest wall with symmetric expansion, stable to anterior and lateral compression without crepitus.  Neck: No midline C-spine tenderness, step-offs, or deformities. C-collar in place. CV: RRR. DP, femoral, and radial pulses 2+ and equal bilaterally. Abdomen: Soft, non-distended, non-tender to palpation. No rebound or guarding. No abrasions/contusions.  Back: No midline T- or L-spine tenderness, step-offs, or deformities. Neuro: Moving all extremities. GCS: 15. 5/5 strength in bilateral upper and lower extremities with sensation intact to light touch throughout.  MSK: Pelvis stable to anterior and lateral compression.  Right ankle with obvious deformity and overlying tenderness.  2+ DP and PT pulses     Procedures   If procedures were preformed on this patient, they are listed below:  .Ortho Injury Treatment  Date/Time: 11/03/2023 12:39 AM  Performed by: Mikeal Hawthorne, MD Authorized by: Glyn Ade, MD   Consent:    Consent obtained:  Verbal   Consent given by:  Patient   Risks discussed:  FractureInjury location: ankle Location details: right ankle Injury type:  fracture-dislocation Fracture type: trimalleolar Pre-procedure neurovascular assessment: neurovascularly intact Pre-procedure distal perfusion: normal Pre-procedure neurological function: normal Pre-procedure range of motion: reduced Anesthesia: hematoma block  Anesthesia: Local anesthesia used: yes Local Anesthetic: lidocaine 1% without epinephrine Anesthetic total: 20 mL  Patient sedated: NoManipulation performed: yes Skeletal traction used: yes Reduction successful: yes X-ray confirmed reduction: yes Immobilization: splint Splint type: ankle stirrup Splint Applied by: ED Provider and Ortho Tech Supplies used: Ortho-Glass Post-procedure neurovascular assessment: post-procedure neurovascularly intact Post-procedure distal perfusion: normal Post-procedure neurological function: normal Post-procedure range of motion: improved      Mikeal Hawthorne, MD Emergency Medicine PGY-2   Please note that this documentation was produced with the assistance of voice-to-text technology and may contain errors.    Mikeal Hawthorne, MD 11/03/23 Marcie Bal    Glyn Ade, MD 11/03/23 1500

## 2023-11-02 NOTE — ED Notes (Addendum)
Xray called to verify reduction. PMS intact for R ankle post reduction.

## 2023-11-02 NOTE — Progress Notes (Signed)
   11/02/23 1523  Spiritual Encounters  Type of Visit Initial  Care provided to: Pt and family  Conversation partners present during encounter Nurse  Referral source Trauma page  Reason for visit Urgent spiritual support  OnCall Visit Yes  Spiritual Framework  Presenting Themes Significant life change;Community and relationships  Community/Connection Family  Patient Stress Factors Health changes  Family Stress Factors Health changes  Interventions  Spiritual Care Interventions Made Established relationship of care and support;Compassionate presence;Reflective listening;Normalization of emotions;Encouragement  Intervention Outcomes  Outcomes Connection to spiritual care;Awareness around self/spiritual resourses;Awareness of health;Awareness of support;Reduced fear  Advance Directives (For Healthcare)  Does Patient Have a Medical Advance Directive? No  Would patient like information on creating a medical advance directive? No - Patient declined    Chaplain was paged to trauma level 2. Pt had fallen while climbing. She injured her right leg. Her brother was in bedside. Chaplains listened attentively and provided support the patient and her brother.  If more assistance is needed, chaplains are available.  Laurie [name] - M.Kubra Akyol Chaplain [phone]

## 2023-11-02 NOTE — ED Notes (Signed)
Trauma Response Nurse Documentation   Laurie [name] is a 23 y.o. adult arriving to [facility] ED via EMS  On No antithrombotic. Trauma was activated as a Level 2 by ED Charge RN based on the following trauma criteria Tachycardia > 120 in an adult (>67 y/o) Due to 2ft fall off of rock climbing wall. No CTs ordered at this time.   GCS 15.  History   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/28/2018   Prediabetes 02/08/2016   Vitamin D deficiency 12/21/2014     Past Surgical History:  Procedure Laterality Date   COLONOSCOPY WITH ESOPHAGOGASTRODUODENOSCOPY (EGD)  10/27/2022     Initial Focused Assessment (If applicable, or please see trauma documentation): Airway: intact, patent Breathing: Breath sounds clear, equal bilaterally. Circulation: No external hemorrhage. Pulses intact centrally and peripherally. Focused: obvious right ankle deformity +2 DP pulses +2 Posterior tibial pulse. 18G PIV to L AC Disability: PERRLA, MAE. Wiggles R toes, sensation intact. A/Ox4 VS by exception: tachycardia   CT's Completed:   none at this time.  Interventions:  -Labs drawn -1mg  dilauded given -R ankle XR -Reduction at bedside w/ lidocaine injection prior -Splinted R ankle -Post reduction XR done  -Ortho consulted  Plan for disposition:  OR   Consults completed:  Orthopaedic Surgeon called at 59 - Dr Hulda Humphrey.  Event Summary: Pt was [facility] without a harness when she fell, landing on her ankle.  Pt states that she felt and heard it "pop" no other injuries.  Pt did not fall and hit head.  Pt arrived to ED in c-collar but was cleared at bedside by Dr Doran Durand.  Pt received a total of of fentanyl by [facility] en route.  Pt's brother, Reuel Boom here and at bedside.  Pt states that she currently live with her grandparents and called them while in the ED to notify them of the events.    Bedside handoff with ED RN Drue Novel  Trauma Response  RN  Please call TRN at [phone] for further assistance.

## 2023-11-04 ENCOUNTER — Other Ambulatory Visit: Payer: Self-pay | Admitting: Orthopaedic Surgery

## 2023-11-04 DIAGNOSIS — S82871A Displaced pilon fracture of right tibia, initial encounter for closed fracture: Secondary | ICD-10-CM | POA: Diagnosis not present

## 2023-11-11 ENCOUNTER — Encounter (HOSPITAL_COMMUNITY): Payer: Self-pay | Admitting: Orthopaedic Surgery

## 2023-11-11 ENCOUNTER — Other Ambulatory Visit: Payer: Self-pay

## 2023-11-11 NOTE — Progress Notes (Signed)
Spoke with patient made aware to continue with instructions that were provided when we spoke.  Patient had reported that grandfather was sick two weeks with cough and unknown if there was a temperature. Patient reported she had temperature on 11-08-23 100.4. As of today no longer reports fever but a small productive cough. Anesthesia made aware of above and reports further eval in am when patient arrive

## 2023-11-11 NOTE — Progress Notes (Signed)
PCP - Georganna Skeans, MD  Cardiologist -   PPM/ICD - denies Device Orders - n/a Rep Notified - n/a  Chest x-ray - denies EKG - denies Stress Test - denies ECHO - denies Cardiac Cath - denies  CPAP - denies  DM - denies  Blood Thinner Instructions: denies Aspirin Instructions: n/a  ERAS Protcol - clear liquids until 8:30  COVID TEST- n/a  Anesthesia review: yes per patient grandfather sick two weeks ago with cough unknown if he had a fever home covid test negative. Per patient temperature on 11-08-23 100.4. Does report small productive cough as of today no longer has a fever. Per patient home covid test negative a well.  Patient verbally denies any shortness of breath, fever, cough and chest pain during phone call   -------------  SDW INSTRUCTIONS given:  Your procedure is scheduled on November 12, 2023.  Report to [facility] Main Entrance "A" at 9:00 A.M., and check in at the Admitting office.  Call this number if you have problems the morning of surgery:  [phone]   Remember:  Do not eat after midnight the night before your surgery  You may drink clear liquids until 8:30 the morning of your surgery.   Clear liquids allowed are: Water, Non-Citrus Juices (without pulp), Carbonated Beverages, Clear Tea, Black Coffee Only, and Gatorade    Take these medicines the morning of surgery with A SIP OF WATER  acetaminophen (TYLENOL)   As of today, STOP taking any Aspirin (unless otherwise instructed by your surgeon) Aleve, Naproxen, Ibuprofen, Motrin, Advil, Goody's, BC's, all herbal medications, fish oil, and all vitamins.                      Do not wear jewelry, make up, or nail polish            Do not wear lotions, powders, perfumes/colognes, or deodorant.            Do not shave 48 hours prior to surgery.  Men may shave face and neck.            Do not bring valuables to the hospital.            [facility] is not responsible for any belongings or valuables.  Do  NOT Smoke (Tobacco/Vaping) 24 hours prior to your procedure If you use a CPAP at night, you may bring all equipment for your overnight stay.   Contacts, glasses, dentures or bridgework may not be worn into surgery.      For patients admitted to the hospital, discharge time will be determined by your treatment team.   Patients discharged the day of surgery will not be allowed to drive home, and someone needs to stay with them for 24 hours.    Special instructions:   [place]- Preparing For Surgery  Before surgery, you can play an important role. Because skin is not sterile, your skin needs to be as free of germs as possible. You can reduce the number of germs on your skin by washing with CHG (chlorahexidine gluconate) Soap before surgery.  CHG is an antiseptic cleaner which kills germs and bonds with the skin to continue killing germs even after washing.    Oral Hygiene is also important to reduce your risk of infection.  Remember - BRUSH YOUR TEETH THE MORNING OF SURGERY WITH YOUR REGULAR TOOTHPASTE  Please do not use if you have an allergy to CHG or antibacterial soaps. If your skin  becomes reddened/irritated stop using the CHG.  Do not shave (including legs and underarms) for at least 48 hours prior to first CHG shower. It is OK to shave your face.  Please follow these instructions carefully.   Shower the NIGHT BEFORE SURGERY and the MORNING OF SURGERY with DIAL Soap.   Pat yourself dry with a CLEAN TOWEL.  Wear CLEAN PAJAMAS to bed the night before surgery  Place CLEAN SHEETS on your bed the night of your first shower and DO NOT SLEEP WITH PETS.   Day of Surgery: Please shower morning of surgery  Wear Clean/Comfortable clothing the morning of surgery Do not apply any deodorants/lotions.   Remember to brush your teeth WITH YOUR REGULAR TOOTHPASTE.   Questions were answered. Patient verbalized understanding of instructions.

## 2023-11-12 ENCOUNTER — Ambulatory Visit (HOSPITAL_COMMUNITY): Payer: Medicaid Other | Admitting: Anesthesiology

## 2023-11-12 ENCOUNTER — Encounter (HOSPITAL_COMMUNITY): Payer: Self-pay | Admitting: Orthopaedic Surgery

## 2023-11-12 ENCOUNTER — Other Ambulatory Visit: Payer: Self-pay

## 2023-11-12 ENCOUNTER — Encounter (HOSPITAL_COMMUNITY)
Admission: RE | Disposition: A | Discharge status: Home / Self Care | Payer: Self-pay | Source: Home / Self Care | Attending: Orthopaedic Surgery

## 2023-11-12 ENCOUNTER — Ambulatory Visit (HOSPITAL_COMMUNITY): Payer: Medicaid Other

## 2023-11-12 ENCOUNTER — Ambulatory Visit (HOSPITAL_COMMUNITY)
Admission: RE | Admit: 2023-11-12 | Discharge: 2023-11-12 | Disposition: A | Discharge status: Home / Self Care | Payer: Medicaid Other | Attending: Orthopaedic Surgery | Admitting: Orthopaedic Surgery

## 2023-11-12 DIAGNOSIS — S82891A Other fracture of right lower leg, initial encounter for closed fracture: Secondary | ICD-10-CM | POA: Diagnosis not present

## 2023-11-12 DIAGNOSIS — S82831A Other fracture of upper and lower end of right fibula, initial encounter for closed fracture: Secondary | ICD-10-CM | POA: Diagnosis not present

## 2023-11-12 DIAGNOSIS — R519 Headache, unspecified: Secondary | ICD-10-CM | POA: Diagnosis not present

## 2023-11-12 DIAGNOSIS — W1789XA Other fall from one level to another, initial encounter: Secondary | ICD-10-CM | POA: Diagnosis not present

## 2023-11-12 DIAGNOSIS — F17210 Nicotine dependence, cigarettes, uncomplicated: Secondary | ICD-10-CM | POA: Diagnosis not present

## 2023-11-12 DIAGNOSIS — Y9331 Activity, mountain climbing, rock climbing and wall climbing: Secondary | ICD-10-CM | POA: Insufficient documentation

## 2023-11-12 DIAGNOSIS — S82871A Displaced pilon fracture of right tibia, initial encounter for closed fracture: Secondary | ICD-10-CM | POA: Insufficient documentation

## 2023-11-12 DIAGNOSIS — G8918 Other acute postprocedural pain: Secondary | ICD-10-CM | POA: Diagnosis not present

## 2023-11-12 DIAGNOSIS — S82301D Unspecified fracture of lower end of right tibia, subsequent encounter for closed fracture with routine healing: Secondary | ICD-10-CM | POA: Diagnosis not present

## 2023-11-12 HISTORY — PX: ORIF ANKLE FRACTURE: SHX5408

## 2023-11-12 LAB — POCT PREGNANCY, URINE: Preg Test, Ur: NEGATIVE

## 2023-11-12 LAB — SURGICAL PCR SCREEN
MRSA, PCR: NEGATIVE
Staphylococcus aureus: NEGATIVE

## 2023-11-12 SURGERY — OPEN REDUCTION INTERNAL FIXATION (ORIF) ANKLE FRACTURE
Anesthesia: Regional | Site: Ankle | Laterality: Right

## 2023-11-12 MED ORDER — PROPOFOL 10 MG/ML IV BOLUS
INTRAVENOUS | Status: AC
  Filled 2023-11-12: qty 20

## 2023-11-12 MED ORDER — POVIDONE-IODINE 10 % EX SWAB
2.0000 | Freq: Once | CUTANEOUS | Status: AC
  Administered 2023-11-12: 2 via TOPICAL

## 2023-11-12 MED ORDER — DEXAMETHASONE SODIUM PHOSPHATE 10 MG/ML IJ SOLN
INTRAMUSCULAR | Status: DC | PRN
  Administered 2023-11-12 (×2): 5 mg

## 2023-11-12 MED ORDER — PROPOFOL 10 MG/ML IV BOLUS
INTRAVENOUS | Status: DC | PRN
  Administered 2023-11-12: 200 mg via INTRAVENOUS

## 2023-11-12 MED ORDER — AMISULPRIDE (ANTIEMETIC) 5 MG/2ML IV SOLN
10.0000 mg | Freq: Once | INTRAVENOUS | Status: AC
  Administered 2023-11-12: 10 mg via INTRAVENOUS

## 2023-11-12 MED ORDER — FENTANYL CITRATE (PF) 250 MCG/5ML IJ SOLN
INTRAMUSCULAR | Status: DC | PRN
  Administered 2023-11-12: 150 ug via INTRAVENOUS
  Administered 2023-11-12 (×2): 50 ug via INTRAVENOUS

## 2023-11-12 MED ORDER — FENTANYL CITRATE (PF) 100 MCG/2ML IJ SOLN: 25.0000 ug | INTRAMUSCULAR | Status: DC | PRN

## 2023-11-12 MED ORDER — FENTANYL CITRATE (PF) 100 MCG/2ML IJ SOLN: 100.0000 ug | Freq: Once | INTRAMUSCULAR | Status: AC

## 2023-11-12 MED ORDER — ASPIRIN 325 MG PO TABS: 325.0000 mg | ORAL_TABLET | Freq: Every day | ORAL | 3 refills | Status: AC

## 2023-11-12 MED ORDER — CHLORHEXIDINE GLUCONATE 0.12 % MT SOLN
15.0000 mL | Freq: Once | OROMUCOSAL | Status: AC
  Administered 2023-11-12: 15 mL via OROMUCOSAL
  Filled 2023-11-12: qty 15

## 2023-11-12 MED ORDER — SCOPOLAMINE 1 MG/3DAYS TD PT72
MEDICATED_PATCH | TRANSDERMAL | Status: AC
  Administered 2023-11-12: 1.5 mg via TRANSDERMAL
  Filled 2023-11-12: qty 1

## 2023-11-12 MED ORDER — MIDAZOLAM HCL 2 MG/2ML IJ SOLN: 2.0000 mg | Freq: Once | INTRAMUSCULAR | Status: AC

## 2023-11-12 MED ORDER — CEFAZOLIN SODIUM-DEXTROSE 2-4 GM/100ML-% IV SOLN
2.0000 g | INTRAVENOUS | Status: AC
  Administered 2023-11-12: 2 g via INTRAVENOUS
  Filled 2023-11-12: qty 100

## 2023-11-12 MED ORDER — SCOPOLAMINE 1 MG/3DAYS TD PT72
1.0000 | MEDICATED_PATCH | TRANSDERMAL | Status: DC
  Filled 2023-11-12: qty 1

## 2023-11-12 MED ORDER — MIDAZOLAM HCL 2 MG/2ML IJ SOLN
INTRAMUSCULAR | Status: AC
  Filled 2023-11-12: qty 2

## 2023-11-12 MED ORDER — ORAL CARE MOUTH RINSE: 15.0000 mL | Freq: Once | OROMUCOSAL | Status: AC

## 2023-11-12 MED ORDER — LIDOCAINE 2% (20 MG/ML) 5 ML SYRINGE
INTRAMUSCULAR | Status: DC | PRN
  Administered 2023-11-12: 40 mg via INTRAVENOUS

## 2023-11-12 MED ORDER — FENTANYL CITRATE (PF) 250 MCG/5ML IJ SOLN
INTRAMUSCULAR | Status: AC
  Filled 2023-11-12: qty 5

## 2023-11-12 MED ORDER — 0.9 % SODIUM CHLORIDE (POUR BTL) OPTIME
TOPICAL | Status: DC | PRN
  Administered 2023-11-12: 1000 mL

## 2023-11-12 MED ORDER — ACETAMINOPHEN 500 MG PO TABS: 1000.0000 mg | ORAL_TABLET | Freq: Once | ORAL | Status: AC

## 2023-11-12 MED ORDER — FENTANYL CITRATE (PF) 100 MCG/2ML IJ SOLN
INTRAMUSCULAR | Status: AC
  Administered 2023-11-12: 100 ug via INTRAVENOUS
  Filled 2023-11-12: qty 2

## 2023-11-12 MED ORDER — DEXAMETHASONE SODIUM PHOSPHATE 10 MG/ML IJ SOLN
INTRAMUSCULAR | Status: DC | PRN
  Administered 2023-11-12: 10 mg via INTRAVENOUS

## 2023-11-12 MED ORDER — MIDAZOLAM HCL 2 MG/2ML IJ SOLN
INTRAMUSCULAR | Status: AC
  Administered 2023-11-12: 2 mg via INTRAVENOUS
  Filled 2023-11-12: qty 2

## 2023-11-12 MED ORDER — AMISULPRIDE (ANTIEMETIC) 5 MG/2ML IV SOLN
INTRAVENOUS | Status: AC
  Filled 2023-11-12: qty 4

## 2023-11-12 MED ORDER — LACTATED RINGERS IV SOLN: INTRAVENOUS | Status: DC | PRN

## 2023-11-12 MED ORDER — LACTATED RINGERS IV SOLN
INTRAVENOUS | Status: DC
Start: 2023-11-12 — End: 2023-11-12

## 2023-11-12 MED ORDER — OXYCODONE HCL 5 MG PO TABS: 5.0000 mg | ORAL_TABLET | ORAL | 0 refills | Status: AC | PRN

## 2023-11-12 MED ORDER — ONDANSETRON HCL 4 MG/2ML IJ SOLN
INTRAMUSCULAR | Status: DC | PRN
  Administered 2023-11-12: 4 mg via INTRAVENOUS

## 2023-11-12 MED ORDER — MIDAZOLAM HCL 2 MG/2ML IJ SOLN
INTRAMUSCULAR | Status: DC | PRN
  Administered 2023-11-12: 2 mg via INTRAVENOUS

## 2023-11-12 MED ORDER — ROPIVACAINE HCL 5 MG/ML IJ SOLN
INTRAMUSCULAR | Status: DC | PRN
  Administered 2023-11-12: 10 mL via PERINEURAL
  Administered 2023-11-12: 30 mL via PERINEURAL

## 2023-11-12 MED ORDER — ACETAMINOPHEN 500 MG PO TABS
ORAL_TABLET | ORAL | Status: AC
  Administered 2023-11-12: 1000 mg via ORAL
  Filled 2023-11-12: qty 2

## 2023-11-12 SURGICAL SUPPLY — 66 items
ALCOHOL 70% 16 OZ (MISCELLANEOUS) ×1 IMPLANT
BAG COUNTER SPONGE SURGICOUNT (BAG) ×1 IMPLANT
BANDAGE ESMARK 6X9 LF (GAUZE/BANDAGES/DRESSINGS) IMPLANT
BIT DRILL 2 CANN GRADUATED (BIT) IMPLANT
BIT DRILL 2.5 CANN STRL (BIT) IMPLANT
BLADE SURG 15 STRL LF DISP TIS (BLADE) ×1 IMPLANT
BNDG COHESIVE 4X5 TAN STRL (GAUZE/BANDAGES/DRESSINGS) IMPLANT
BNDG COHESIVE 6X5 TAN ST LF (GAUZE/BANDAGES/DRESSINGS) IMPLANT
BNDG ELASTIC 4INX 5YD STR LF (GAUZE/BANDAGES/DRESSINGS) IMPLANT
BNDG ELASTIC 6INX 5YD STR LF (GAUZE/BANDAGES/DRESSINGS) IMPLANT
BNDG ELASTIC 6X10 VLCR STRL LF (GAUZE/BANDAGES/DRESSINGS) ×1 IMPLANT
BNDG ESMARK 6X9 LF (GAUZE/BANDAGES/DRESSINGS) IMPLANT
CANISTER SUCT 3000ML PPV (MISCELLANEOUS) ×1 IMPLANT
CHLORAPREP W/TINT 26 (MISCELLANEOUS) ×2 IMPLANT
COVER SURGICAL LIGHT HANDLE (MISCELLANEOUS) ×1 IMPLANT
CUFF TOURN SGL QUICK 42 (TOURNIQUET CUFF) IMPLANT
CUFF TRNQT CYL 34X4.125X (TOURNIQUET CUFF) ×1 IMPLANT
DRAPE C-ARM 35X43 STRL (DRAPES) IMPLANT
DRAPE C-ARMOR (DRAPES) IMPLANT
DRAPE OEC MINIVIEW 54X84 (DRAPES) ×1 IMPLANT
DRAPE U-SHAPE 47X51 STRL (DRAPES) ×1 IMPLANT
DRILL SRG 2.8XANKL PLAT (DRILL) IMPLANT
DRSG MEPITEL 4X7.2 (GAUZE/BANDAGES/DRESSINGS) ×1 IMPLANT
DRSG XEROFORM 1X8 (GAUZE/BANDAGES/DRESSINGS) ×1 IMPLANT
ELECT REM PT RETURN 9FT ADLT (ELECTROSURGICAL) ×1 IMPLANT
ELECTRODE REM PT RTRN 9FT ADLT (ELECTROSURGICAL) ×1 IMPLANT
GAUZE PAD ABD 8X10 STRL (GAUZE/BANDAGES/DRESSINGS) ×2 IMPLANT
GAUZE SPONGE 4X4 12PLY STRL (GAUZE/BANDAGES/DRESSINGS) IMPLANT
GAUZE SPONGE 4X4 12PLY STRL LF (GAUZE/BANDAGES/DRESSINGS) ×1 IMPLANT
GLOVE BIOGEL M STRL SZ7.5 (GLOVE) ×1 IMPLANT
GLOVE BIOGEL PI IND STRL 8 (GLOVE) ×1 IMPLANT
GLOVE SRG 8 PF TXTR STRL LF DI (GLOVE) ×1 IMPLANT
GLOVE SURG ENC TEXT LTX SZ7.5 (GLOVE) ×1 IMPLANT
GOWN STRL REUS W/ TWL LRG LVL3 (GOWN DISPOSABLE) ×1 IMPLANT
GOWN STRL REUS W/ TWL XL LVL3 (GOWN DISPOSABLE) ×2 IMPLANT
GUIDEPIN TT 1.5X150 (PIN) IMPLANT
KIT BASIN OR (CUSTOM PROCEDURE TRAY) ×1 IMPLANT
KIT TURNOVER KIT B (KITS) ×1 IMPLANT
NS IRRIG 1000ML POUR BTL (IV SOLUTION) ×1 IMPLANT
PACK ORTHO EXTREMITY (CUSTOM PROCEDURE TRAY) ×1 IMPLANT
PAD ARMBOARD 7.5X6 YLW CONV (MISCELLANEOUS) ×2 IMPLANT
PAD CAST 4YDX4 CTTN HI CHSV (CAST SUPPLIES) ×1 IMPLANT
PADDING CAST COTTON 6X4 STRL (CAST SUPPLIES) IMPLANT
PADDING CAST SYNTHETIC 4X4 STR (CAST SUPPLIES) IMPLANT
PLATE LOCK DIST FIB RT TI 4H (Plate) IMPLANT
PLATE MED PILON SHORT 13H RT (Plate) IMPLANT
SCREW LOCK COMP 3X16 (Screw) IMPLANT
SCREW LOCK VA 3.5X30 (Screw) IMPLANT
SCREW LOCK VA 3.5X32 (Screw) IMPLANT
SCREW LOCK VA 3.5X36 (Screw) IMPLANT
SCREW LP 3.5X12MM (Screw) IMPLANT
SCREW LP TI 3.5X14MM (Screw) IMPLANT
SCREW NLOCK DIST 3.5X26 (Screw) IMPLANT
SCREW NLOCK DIST 3.5X28 (Screw) IMPLANT
SCREW NONLOCK 3.5X24 (Screw) IMPLANT
SCREW VAL KREULOCK 3.0X14 TI (Screw) IMPLANT
SPONGE T-LAP 18X18 ~~LOC~~+RFID (SPONGE) ×1 IMPLANT
STAPLER SKIN PROX WIDE 3.9 (STAPLE) IMPLANT
SUCTION TUBE FRAZIER 10FR DISP (SUCTIONS) ×1 IMPLANT
SUT ETHILON 3 0 PS 1 (SUTURE) ×1 IMPLANT
SUT MNCRL AB 3-0 PS2 27 (SUTURE) ×1 IMPLANT
SUT VIC AB 2-0 CT1 TAPERPNT 27 (SUTURE) ×2 IMPLANT
SUT VIC AB 2-0 CTB1 (SUTURE) IMPLANT
TOWEL GREEN STERILE (TOWEL DISPOSABLE) ×1 IMPLANT
TOWEL GREEN STERILE FF (TOWEL DISPOSABLE) ×1 IMPLANT
TUBE CONNECTING 12X1/4 (SUCTIONS) ×1 IMPLANT

## 2023-11-12 NOTE — Anesthesia Preprocedure Evaluation (Addendum)
Anesthesia Evaluation  Patient identified by MRN, date of birth, ID band Patient awake    Reviewed: Allergy & Precautions, NPO status , Patient's Chart, lab work & pertinent test results  Airway Mallampati: II  TM Distance: >3 FB Neck ROM: Full    Dental  (+) Teeth Intact, Dental Advisory Given   Pulmonary Current Smoker and Patient abstained from smoking.   Pulmonary exam normal breath sounds clear to auscultation       Cardiovascular negative cardio ROS Normal cardiovascular exam Rhythm:Regular Rate:Normal     Neuro/Psych  Headaches  negative psych ROS   GI/Hepatic negative GI ROS, Neg liver ROS,,,  Endo/Other  negative endocrine ROS    Renal/GU negative Renal ROS  negative genitourinary   Musculoskeletal negative musculoskeletal ROS (+)    Abdominal   Peds  Hematology negative hematology ROS (+)   Anesthesia Other Findings   Reproductive/Obstetrics                             Anesthesia Physical Anesthesia Plan  ASA: 2  Anesthesia Plan: General and Regional   Post-op Pain Management: Regional block* and Tylenol PO (pre-op)*   Induction: Intravenous  PONV Risk Score and Plan: 2 and Ondansetron, Dexamethasone and Midazolam  Airway Management Planned: LMA  Additional Equipment:   Intra-op Plan:   Post-operative Plan: Extubation in OR  Informed Consent: I have reviewed the patients History and Physical, chart, labs and discussed the procedure including the risks, benefits and alternatives for the proposed anesthesia with the patient or authorized representative who has indicated his/her understanding and acceptance.     Dental advisory given  Plan Discussed with: CRNA  Anesthesia Plan Comments:        Anesthesia Quick Evaluation

## 2023-11-12 NOTE — Anesthesia Postprocedure Evaluation (Signed)
Anesthesia Post Note  Patient: Art therapist  Procedure(s) Performed: OPEN TREATMENT RIGHT PILON ANKLE FRACTURE WITH FIBULA (Right: Ankle)     Patient location during evaluation: PACU Anesthesia Type: Regional and General Level of consciousness: awake and alert Pain management: pain level controlled Vital Signs Assessment: post-procedure vital signs reviewed and stable Respiratory status: spontaneous breathing, nonlabored ventilation, respiratory function stable and patient connected to nasal cannula oxygen Cardiovascular status: blood pressure returned to baseline and stable Postop Assessment: no apparent nausea or vomiting Anesthetic complications: no  No notable events documented.  Last Vitals:  Vitals:   11/12/23 1415 11/12/23 1430  BP: 121/67 119/70  Pulse: 78 86  Resp: 18 17  Temp:    SpO2: 96% 97%    Last Pain:  Vitals:   11/12/23 1430  TempSrc:   PainSc: 0-No pain                 Caisen Mangas L Geovana Gebel

## 2023-11-12 NOTE — H&P (Signed)
PREOPERATIVE H&P  Chief Complaint: Right ankle pain  HPI: Laurie [name] is a 23 y.o. adult who presents for preoperative history and physical with a diagnosis of right pilon ankle fracture.  She sustained this after a fall.  She was seen in the office where x-rays and CT scan revealed the injury.  She is here today for surgery.. Symptoms are rated as moderate to severe, and have been worsening.  This is significantly impairing activities of daily living.  Laurie [name] has elected for surgical management.   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/28/2018   Prediabetes 02/08/2016   Vitamin D deficiency 12/21/2014   Past Surgical History:  Procedure Laterality Date   COLONOSCOPY WITH ESOPHAGOGASTRODUODENOSCOPY (EGD)  10/27/2022   Social History   Socioeconomic History   Marital status: Single    Spouse name: Not on file   Number of children: Not on file   Years of education: Not on file   Highest education level: Not on file  Occupational History   Not on file  Tobacco Use   Smoking status: Some Days    Types: Cigarettes   Smokeless tobacco: Never  Vaping Use   Vaping status: Former   Quit date: 10/01/2022   Substances: Nicotine  Substance and Sexual Activity   Alcohol use: Yes    Comment: socially   Drug use: Never   Sexual activity: Not on file  Other Topics Concern   Not on file  Social History Narrative   Not on file   Social Drivers of Health   Financial Resource Strain: Not on file  Food Insecurity: Not on file  Transportation Needs: Not on file  Physical Activity: Not on file  Stress: Not on file  Social Connections: Not on file   Family History  Problem Relation Age of Onset   Colon cancer Maternal Grandmother    Cancer Maternal Grandmother    Alcohol abuse Maternal Grandmother    Diabetes Maternal Grandfather    Heart disease Maternal Grandfather    Hyperlipidemia Maternal Grandfather    Hypertension Maternal Grandfather     Alcohol abuse Maternal Grandfather    Obesity Paternal Grandmother    Hyperlipidemia Paternal Grandmother    Hypertension Paternal Grandmother    Obesity Paternal Grandfather    Heart disease Paternal Grandfather        A fib related to history of rheumatic heart disease   Diabetes Paternal Grandfather    Asthma Neg Hx    Esophageal cancer Neg Hx    Stomach cancer Neg Hx    Rectal cancer Neg Hx    No Known Allergies Prior to Admission medications   Medication Sig Start Date End Date Taking? Authorizing Provider  acetaminophen (TYLENOL) 500 MG tablet Take 500 mg by mouth 2 (two) times daily as needed for moderate pain (pain score 4-6) or headache.   Yes [provider]  Norgestrel (OPILL) 0.075 MG TABS Take 0.075 mg by mouth daily at 12 noon.   Yes [provider]     Positive ROS: All other systems have been reviewed and were otherwise negative with the exception of those mentioned in the HPI and as above.  Physical Exam:  Vitals:   11/12/23 0934  BP: (!) 148/79  Pulse: (!) 103  Resp: 18  Temp: 98 F (36.7 C)   General: Alert, no acute distress Cardiovascular: No pedal edema Respiratory: No cyanosis, no use of accessory musculature GI: No organomegaly, abdomen is soft  and non-tender Skin: No lesions in the area of chief complaint Neurologic: Sensation intact distally Psychiatric: Patient is competent for consent with normal mood and affect Lymphatic: No axillary or cervical lymphadenopathy  MUSCULOSKELETAL: Right ankle in a short leg splint.  Slightly valgus position.  Forefoot is swollen with ecchymosis noted.  No tenderness distally or proximally to the splint.  Foot is warm and well-perfused.  Assessment: Right pilon ankle fracture with associated fibula.    Plan: Plan for open treatment of her pilon ankle fracture with fibular fixation.  Will check the syndesmosis but it appears stable on imaging.  She will be discharged from the PACU.  We  discussed the risks, benefits and alternatives of surgery which include but are not limited to wound healing complications, infection, nonunion, malunion, need for further surgery, damage to surrounding structures and continued pain.  They understand there is no guarantees to an acceptable outcome.  After weighing these risks they opted to proceed with surgery.     Terance Hart, MD    11/12/2023 10:57 AM

## 2023-11-12 NOTE — Transfer of Care (Signed)
Immediate Anesthesia Transfer of Care Note  Patient: Laurie [name]  Procedure(s) Performed: OPEN TREATMENT RIGHT PILON ANKLE FRACTURE WITH FIBULA (Right: Ankle)  Patient Location: PACU  Anesthesia Type:General  Level of Consciousness: awake, alert , and oriented  Airway & Oxygen Therapy: Patient Spontanous Breathing  Post-op Assessment: Report given to RN and Post -op Vital signs reviewed and stable  Post vital signs: Reviewed and stable  Last Vitals:  Vitals Value Taken Time  BP 132/74 11/12/23 1353  Temp 36.8 C 11/12/23 1353  Pulse 87 11/12/23 1355  Resp    SpO2 99 % 11/12/23 1355  Vitals shown include unfiled device data.  Last Pain:  Vitals:   11/12/23 0946  TempSrc:   PainSc: 0-No pain         Complications: No notable events documented.

## 2023-11-12 NOTE — Anesthesia Procedure Notes (Signed)
Anesthesia Regional Block: Popliteal block   Pre-Anesthetic Checklist: , timeout performed,  Correct Patient, Correct Site, Correct Laterality,  Correct Procedure, Correct Position, site marked,  Risks and benefits discussed,  Surgical consent,  Pre-op evaluation,  At surgeon's request and post-op pain management  Laterality: Right  Prep: Maximum Sterile Barrier Precautions used, chloraprep       Needles:  Injection technique: Single-shot  Needle Type: Echogenic Stimulator Needle     Needle Length: 9cm  Needle Gauge: 22     Additional Needles:   Procedures:,,,, ultrasound used (permanent image in chart),,    Narrative:  Start time: 11/12/2023 10:55 AM End time: 11/12/2023 11:00 AM Injection made incrementally with aspirations every 5 mL.  Performed by: Personally  Anesthesiologist: Elmer Picker, MD  Additional Notes: Monitors applied. No increased pain on injection. No increased resistance to injection. Injection made in 5cc increments. Good needle visualization. Patient tolerated procedure well.

## 2023-11-12 NOTE — Discharge Instructions (Signed)
DR. Susa Simmonds FOOT & ANKLE SURGERY POST-OP INSTRUCTIONS   Pain Management The numbing medicine and your leg will last around 18 hours, take a dose of your pain medicine as soon as you feel it wearing off to avoid rebound pain. Keep your foot elevated above heart level.  Make sure that your heel hangs free ('floats'). Take all prescribed medication as directed. If taking narcotic pain medication you may want to use an over-the-counter stool softener to avoid constipation. You may take over-the-counter NSAIDs (ibuprofen, naproxen, etc.) as well as over-the-counter acetaminophen as directed on the packaging as a supplement for your pain and may also use it to wean away from the prescription medication.  Activity Non-weightbearing Keep splint intact  First Postoperative Visit Your first postop visit will be at least 2 weeks after surgery.  This should be scheduled when you schedule surgery. If you do not have a postoperative visit scheduled please call [phone] to schedule an appointment. At the appointment your incision will be evaluated for suture removal, x-rays will be obtained if necessary.  General Instructions Swelling is very common after foot and ankle surgery.  It often takes 3 months for the foot and ankle to begin to feel comfortable.  Some amount of swelling will persist for 6-12 months. DO NOT change the dressing.  If there is a problem with the dressing (too tight, loose, gets wet, etc.) please contact Dr. Donnie Mesa office. DO NOT get the dressing wet.  For showers you can use an over-the-counter cast cover or wrap a washcloth around the top of your dressing and then cover it with a plastic bag and tape it to your leg. DO NOT soak the incision (no tubs, pools, bath, etc.) until you have approval from Dr. Susa Simmonds.  Contact Dr. Garret Reddish office or go to Emergency Room if: Temperature above 101 F. Increasing pain that is unresponsive to pain medication or elevation Excessive redness or  swelling in your foot Dressing problems - excessive bloody drainage, looseness or tightness, or if dressing gets wet Develop pain, swelling, warmth, or discoloration of your calf

## 2023-11-12 NOTE — Progress Notes (Signed)
Patient called stating she may be a few minutes late arriving for her procedure today. Staff made aware.

## 2023-11-12 NOTE — Anesthesia Procedure Notes (Signed)
Procedure Name: LMA Insertion Date/Time: 11/12/2023 12:08 PM  Performed by: Allyn Kenner, CRNAPre-anesthesia Checklist: Patient identified, Emergency Drugs available, Suction available and Patient being monitored Patient Re-evaluated:Patient Re-evaluated prior to induction Oxygen Delivery Method: Circle System Utilized Preoxygenation: Pre-oxygenation with 100% oxygen Induction Type: IV induction Ventilation: Mask ventilation without difficulty LMA: LMA inserted LMA Size: 4.0 Number of attempts: 1 Airway Equipment and Method: Bite block Placement Confirmation: positive ETCO2 Tube secured with: Tape Dental Injury: Teeth and Oropharynx as per pre-operative assessment

## 2023-11-12 NOTE — Op Note (Signed)
Bora Mateo adult 23 y.o. 11/12/2023  PreOperative Diagnosis: Right pilon ankle fracture with associated fibula fracture  PostOperative Diagnosis: Same  PROCEDURE: Open reduction internal fixation of pilon ankle fracture with fibular fixation  SURGEON: Dub Mikes, MD  ASSISTANT: Wilkie Aye, PA-S  ANESTHESIA: General  FINDINGS: See below  IMPLANTS: Arthrex medial tibial locking plate and fibular locking plate  [id number]:23 y.o. adult sustained the above injury after a fall while rockclimbing.  She had significant comminution of her medial pilon ankle fracture with articular impaction and comminution.  She had associated fibula fracture.  She was indicated for surgery due to her fracture.   Patient understood the risks, benefits and alternatives to surgery which include but are not limited to wound healing complications, infection, nonunion, malunion, need for further surgery as well as damage to surrounding structures. They also understood the potential for continued pain in that there were no guarantees of acceptable outcome After weighing these risks the patient opted to proceed with surgery.  PROCEDURE: Patient was identified in the preoperative holding area.  The right leg was marked by myself.  Consent was signed by myself and the patient.  Block was performed by anesthesia in the preoperative holding area.  Patient was taken to the operative suite and placed supine on the operative table.  General LMA anesthesia was induced without difficulty. Bump was placed under the operative hip and bone foam was used.  All bony prominences were well padded.  Tourniquet was placed on the operative thigh.  Preoperative antibiotics were given. The extremity was prepped and draped in the usual sterile fashion and surgical timeout was performed.  The limb was elevated and the tourniquet was inflated to 250 mmHg.  We began by making a longitudinal incision overlying the medial distal  tibia.  The incision was carried sharply through skin and subcutaneous tissue.  Skin flaps were created.  Care was taken to protect the saphenous nerve and vein.  We are able to gain access to the fracture.  The incision was extended proximal due to the extent of the fracture.  The fracture was a large medial block with comminution of the joint surface on the medial gutter and anterior medial tibial plafond.  We mobilized the fracture and cleared of interposed fracture hematoma and tissue.  Then we are able to peer into the joint and we are able to use a freer and an elevator to disimpact the joint surface and D rotate the osteochondral portions of the fracture into a better position.  Then we reduced the fracture under direct visualization help with provisionally without Weber clamp and K wire fixation.  Then the medial locking plate was placed.  Combination of locking and nonlocking screws were used to fixate the fracture and the articular block.  The plate did not have an anterior screw hole well enough to raft the articular block and area that was disimpacted and therefore a separate screw was placed outside of the plate in a rafting fashion under the articular portion of the fracture.  X-rays revealed acceptable reduction of the joint surface and the fracture.  We then turned our attention to the fibula.  Incision was made overlying the lateral aspect of the fibula.  The incision was carried through skin and subcutaneous tissue down to the bone.  Care was taken to protect the branch of the superficial peroneal nerves which were identified on blunt dissection initially.  Then the fracture was identified and mobilized.  It was a transverse type fracture.  Were able to book open the fracture and peer into the joint and remove any bony fragments from the joint.  After this everything was irrigated.  Tissue was removed and the fracture ends and they are cleaned off using a rongeured.  Then the fracture was reduced  under direct visualization and held provisionally with a K wire.  Then a distal fibular locking plate was placed to stabilize the fracture definitively.  The fracture was not amenable to lag screw fixation.  Then the ankle was stressed under direct visualization and found to be stable with regard to medial clear space and syndesmosis widening.  Provisional fixation was removed and final fluoroscopic images were obtained.  The wounds were then irrigated and closed in a layered fashion using 2-0 Vicryl, 3-0 Monocryl and staples.  Soft dressing was placed.  Short leg splint was placed.  She was then awakened from anesthesia and taken recovery in stable condition.  No complications.   POST OPERATIVE INSTRUCTIONS: Nonweightbearing to operative extremity Keep splint intact and dry Follow-up in 2 weeks for splint removal, nonweightbearing x-rays and placement of the cast Aspirin for DVT prophylaxis  TOURNIQUET TIME: Less than 2 hours  BLOOD LOSS:  Minimal         DRAINS: none         SPECIMEN: none       COMPLICATIONS:  * No complications entered in OR log *         Disposition: PACU - hemodynamically stable.         Condition: stable

## 2023-11-12 NOTE — Anesthesia Procedure Notes (Signed)
Anesthesia Regional Block: Adductor canal block   Pre-Anesthetic Checklist: , timeout performed,  Correct Patient, Correct Site, Correct Laterality,  Correct Procedure, Correct Position, site marked,  Risks and benefits discussed,  Pre-op evaluation,  At surgeon's request and post-op pain management  Laterality: Right  Prep: Maximum Sterile Barrier Precautions used, chloraprep       Needles:  Injection technique: Single-shot  Needle Type: Echogenic Stimulator Needle     Needle Length: 9cm  Needle Gauge: 21     Additional Needles:   Procedures:,,,, ultrasound used (permanent image in chart),,    Narrative:  Start time: 11/12/2023 11:00 AM End time: 11/12/2023 11:03 AM Injection made incrementally with aspirations every 5 mL. Anesthesiologist: Elmer Picker, MD

## 2023-11-23 ENCOUNTER — Encounter (HOSPITAL_COMMUNITY): Payer: Self-pay | Admitting: Orthopaedic Surgery

## 2023-11-30 DIAGNOSIS — S82871A Displaced pilon fracture of right tibia, initial encounter for closed fracture: Secondary | ICD-10-CM | POA: Diagnosis not present

## 2023-12-25 ENCOUNTER — Ambulatory Visit (INDEPENDENT_AMBULATORY_CARE_PROVIDER_SITE_OTHER): Payer: Medicaid Other | Admitting: Family Medicine

## 2023-12-25 ENCOUNTER — Encounter: Payer: Self-pay | Admitting: Family Medicine

## 2023-12-25 VITALS — BP 111/72 | HR 78 | Temp 98.3°F | Resp 16 | Ht 61.0 in | Wt 154.0 lb

## 2023-12-25 DIAGNOSIS — Z3009 Encounter for other general counseling and advice on contraception: Secondary | ICD-10-CM | POA: Diagnosis not present

## 2023-12-25 DIAGNOSIS — B009 Herpesviral infection, unspecified: Secondary | ICD-10-CM | POA: Diagnosis not present

## 2023-12-25 MED ORDER — VALACYCLOVIR HCL 500 MG PO TABS
500.0000 mg | ORAL_TABLET | Freq: Two times a day (BID) | ORAL | 0 refills | Status: AC
Start: 2023-12-25 — End: ?

## 2023-12-25 NOTE — Progress Notes (Signed)
 Established Patient Office Visit  Subjective    Patient ID: Laurie [name], adult    DOB: Nov 30, 2000  Age: 23 y.o. MRN: [id number]  CC:  Chief Complaint  Patient presents with   medication for hsv-1    Referral to gynecologist for birth control    HPI Laurie [name] presents desiring referral for nexplanon placement. Patient also reports that she has been some stressed and has been having outbreaks of cold sores around her mouth.   Outpatient Encounter Medications as of 12/25/2023  Medication Sig   acetaminophen (TYLENOL) 500 MG tablet Take 500 mg by mouth 2 (two) times daily as needed for moderate pain (pain score 4-6) or headache.   aspirin (BAYER ASPIRIN) 325 MG tablet Take 1 tablet (325 mg total) by mouth daily.   Norgestrel (OPILL) 0.075 MG TABS Take 0.075 mg by mouth daily at 12 noon.   valACYclovir (VALTREX) 500 MG tablet Take 1 tablet (500 mg total) by mouth 2 (two) times daily.   No facility-administered encounter medications on file as of 12/25/2023.    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/28/2018   Prediabetes 02/08/2016   Vitamin D deficiency 12/21/2014    Past Surgical History:  Procedure Laterality Date   COLONOSCOPY WITH ESOPHAGOGASTRODUODENOSCOPY (EGD)  10/27/2022   ORIF ANKLE FRACTURE Right 11/12/2023   Procedure: OPEN TREATMENT RIGHT PILON ANKLE FRACTURE WITH FIBULA;  Surgeon: Terance Hart, MD;  Location: [facility] OR;  Service: Orthopedics;  Laterality: Right;    Family History  Problem Relation Age of Onset   Colon cancer Maternal Grandmother    Cancer Maternal Grandmother    Alcohol abuse Maternal Grandmother    Diabetes Maternal Grandfather    Heart disease Maternal Grandfather    Hyperlipidemia Maternal Grandfather    Hypertension Maternal Grandfather    Alcohol abuse Maternal Grandfather    Obesity Paternal Grandmother    Hyperlipidemia Paternal Grandmother    Hypertension Paternal Grandmother    Obesity  Paternal Grandfather    Heart disease Paternal Grandfather        A fib related to history of rheumatic heart disease   Diabetes Paternal Grandfather    Asthma Neg Hx    Esophageal cancer Neg Hx    Stomach cancer Neg Hx    Rectal cancer Neg Hx     Social History   Socioeconomic History   Marital status: Single    Spouse name: Not on file   Number of children: Not on file   Years of education: Not on file   Highest education level: Not on file  Occupational History   Not on file  Tobacco Use   Smoking status: Some Days    Types: Cigarettes   Smokeless tobacco: Never  Vaping Use   Vaping status: Former   Quit date: 10/01/2022   Substances: Nicotine  Substance and Sexual Activity   Alcohol use: Yes    Comment: socially   Drug use: Never   Sexual activity: Not on file  Other Topics Concern   Not on file  Social History Narrative   Not on file   Social Drivers of Health   Financial Resource Strain: Low Risk  (12/25/2023)   Overall Financial Resource Strain (CARDIA)    Difficulty of Paying Living Expenses: Not hard at all  Food Insecurity: No Food Insecurity (12/25/2023)   Hunger Vital Sign    Worried About Running Out of Food in the Last Year: Never true  Ran Out of Food in the Last Year: Never true  Transportation Needs: No Transportation Needs (12/25/2023)   PRAPARE - Administrator, Civil Service (Medical): No    Lack of Transportation (Non-Medical): No  Physical Activity: Inactive (12/25/2023)   Exercise Vital Sign    Days of Exercise per Week: 0 days    Minutes of Exercise per Session: 0 min  Stress: No Stress Concern Present (12/25/2023)   [employer] of Occupational Health - Occupational Stress Questionnaire    Feeling of Stress : Not at all  Social Connections: Moderately Isolated (12/25/2023)   Social Connection and Isolation Panel [NHANES]    Frequency of Communication with Friends and Family: More than three times a week    Frequency of  Social Gatherings with Friends and Family: More than three times a week    Attends Religious Services: 1 to 4 times per year    Active Member of [employer] or Organizations: No    Attends Banker Meetings: Never    Marital Status: Never married  Intimate Partner Violence: Not At Risk (12/25/2023)   Humiliation, Afraid, Rape, and Kick questionnaire    Fear of Current or Ex-Partner: No    Emotionally Abused: No    Physically Abused: No    Sexually Abused: No    Review of Systems  All other systems reviewed and are negative.       Objective    BP 111/72   Pulse 78   Temp 98.3 F (36.8 C) (Oral)   Resp 16   Ht 5\' 1"  (1.549 m)   Wt 154 lb (69.9 kg)   SpO2 98%   BMI 29.10 kg/m   Physical Exam Vitals and nursing note reviewed.  Constitutional:      General: Laurie [name] is not in acute distress. Cardiovascular:     Rate and Rhythm: Normal rate and regular rhythm.  Pulmonary:     Effort: Pulmonary effort is normal.     Breath sounds: Normal breath sounds.  Neurological:     General: No focal deficit present.     Mental Status: Laurie [name] is alert and oriented to person, place, and time.         Assessment & Plan:   Encounter for other general counseling or advice on contraception -     Ambulatory referral to Obstetrics / Gynecology  HSV-1 infection  Other orders -     valACYclovir HCl; Take 1 tablet (500 mg total) by mouth 2 (two) times daily.  Dispense: 30 tablet; Refill: 0     Return if symptoms worsen or fail to improve.   Tommie Raymond, MD

## 2023-12-28 DIAGNOSIS — S82871A Displaced pilon fracture of right tibia, initial encounter for closed fracture: Secondary | ICD-10-CM | POA: Diagnosis not present

## 2023-12-28 DIAGNOSIS — S82831A Other fracture of upper and lower end of right fibula, initial encounter for closed fracture: Secondary | ICD-10-CM | POA: Diagnosis not present

## 2023-12-29 ENCOUNTER — Encounter: Payer: Self-pay | Admitting: Family Medicine

## 2024-01-12 NOTE — Therapy (Signed)
 OUTPATIENT PHYSICAL THERAPY LOWER EXTREMITY EVALUATION   Patient Name: Laurie [name] MRN: [id number] DOB:2001-01-12, 23 y.o., adult Today's Date: 01/13/2024  END OF SESSION:  PT End of Session - 01/13/24 1640     Visit Number 1    Number of Visits 16    Date for PT Re-Evaluation 03/18/24    Authorization Type [place] MEDICAID [employer]    Authorization - Visit Number 1    Authorization - Number of Visits 27    PT Start Time 1636    PT Stop Time 1720    PT Time Calculation (min) 44 min    Activity Tolerance Patient tolerated treatment well    Behavior During Therapy WFL for tasks assessed/performed             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/28/2018   Prediabetes 02/08/2016   Vitamin D deficiency 12/21/2014   Past Surgical History:  Procedure Laterality Date   COLONOSCOPY WITH ESOPHAGOGASTRODUODENOSCOPY (EGD)  10/27/2022   ORIF ANKLE FRACTURE Right 11/12/2023   Procedure: OPEN TREATMENT RIGHT PILON ANKLE FRACTURE WITH FIBULA;  Surgeon: Terance Hart, MD;  Location: [facility] OR;  Service: Orthopedics;  Laterality: Right;   Patient Active Problem List   Diagnosis Date Noted   Endocrine disorder, unspecified 11/21/2019   Menstrual cramps 02/08/2016   Frequent headaches 12/21/2014   BMI (body mass index), pediatric, greater than or equal to 95% for age 30/07/2015    PCP: Georganna Skeans, MD  REFERRING PROVIDER: Terance Hart, MD  REFERRING DIAG: s/p right ankle fracture   THERAPY DIAG:  Pain in right ankle and joints of right foot  Muscle weakness (generalized)  Difficulty in walking, not elsewhere classified  Rationale for Evaluation and Treatment: Rehabilitation  ONSET DATE: 11/02/23 Fx ankle rock climbing with fall from 15 ft. Sx 11/12/23 8 weeks s/p  SUBJECTIVE:   SUBJECTIVE STATEMENT: Pt reports: The ankle is doing well. Walking OK in the walking boot. The ankle is stiff  PERTINENT  HISTORY: See PMH  PAIN:  Are you having pain? Yes: NPRS scale: 0/10 at rest, 4/10 at night after daytine actvitiy Pain location: R ankle Pain description: ache Aggravating factors: prolonged time on feet Relieving factors: Cold pack  PRECAUTIONS: Other: WB in walking boot  RED FLAGS: None   WEIGHT BEARING RESTRICTIONS:  WB in walking boot  Call Dr. Donnie Mesa office for status of Wt bearing exs in standing out of the walking boot while at PT   FALLS:  Has patient fallen in last 6 months? Yes. 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 Susa Simmonds  OBJECTIVE:  Note: Objective measures were completed at Evaluation unless otherwise noted.  DIAGNOSTIC FINDINGS:  DG R Ankle 11/12/23 IMPRESSION: Intraoperative fluoroscopic guidance for ORIF of the distal tibia and fibula.  PATIENT SURVEYS:  LEFS 26/80=33% ability, 67% disability  COGNITION: Overall cognitive status: Within functional limits for tasks assessed     SENSATION: WFL  EDEMA:  Significant swelling present  MUSCLE LENGTH: Hamstrings: Right WNLs deg; Left WNLs deg Maisie Fus test: Right NT deg; Left NT deg  POSTURE: No Significant postural limitations  PALPATION: TTP of the R peri-anlke  LOWER  EXTREMITY ROM:  Bilat hips and knees WNLs Active ROM Right eval Left eval  Hip flexion    Hip extension    Hip abduction    Hip adduction    Hip internal rotation    Hip external rotation    Knee flexion    Knee extension    Ankle dorsiflexion A 10 lacking, P 0 A 13  Ankle plantarflexion A 20 A 40  Ankle inversion A 9 A 32  Ankle eversion A 12 A 21   (Blank rows = not tested)  LOWER EXTREMITY MMT:  Bilat hip and  kness 5/5. R ankle NT MMT Right eval Left eval  Hip flexion    Hip extension    Hip abduction    Hip adduction    Hip internal rotation    Hip external rotation    Knee flexion    Knee extension    Ankle dorsiflexion    Ankle plantarflexion    Ankle inversion    Ankle eversion     (Blank rows = not tested)  LOWER EXTREMITY SPECIAL TESTS:  NT  FUNCTIONAL TESTS: TBA when able to walk with min gait deficit  5 times sit to stand: TBA 2 minute walk test: TBA  GAIT: Distance walked: 200" Assistive device utilized:  walking boot Level of assistance: Modified independence Comments: WNLs c walking boot                                                                                                                                TREATMENT DATE:  [facility] Adult PT Treatment:                                                DATE: 01/13/24 Therapeutic Exercise: Developed, instructed in, and pt completed therex as noted in HEP  Self Care: RICE     PATIENT EDUCATION:  Education details: Eval findings, POC, HEP, self care  Person educated: Patient Education method: Explanation, Demonstration, Tactile cues, Verbal cues, and Handouts Education comprehension: verbalized understanding, returned demonstration, verbal cues required, and tactile cues required  HOME EXERCISE PROGRAM: Access Code: [email or web address] URL: [email or web address] Date: 01/13/2024 Prepared by: Joellyn Rued  Exercises - Long Sitting Calf Stretch with Strap  - 1-2 x daily - 7 x weekly - 1 sets - 3-5 reps - 30 hold - Long Sitting Ankle Pumps  - 1-2 x daily - 7 x weekly - 1-2 sets - 10 reps - Supine Ankle Circles  - 1-2 x daily - 7 x weekly - 1-2 sets - 10 reps - Seated Heel Raise  - 1-2 x daily - 7 x weekly - 1-2 sets - 10 reps - Seated Toe Raise  - 1-2 x daily - 7 x weekly - 1-2 sets - 10 reps - Towel Scrunches  -  1 x daily - 7 x weekly - 1 sets - 10 reps - Toe Yoga - Alternating Great Toe and Lesser Toe  Extension  - 1 x daily - 7 x weekly - 1 sets - 10 reps - Seated Ankle Dorsiflexion with Resistance  - 1 x daily - 7 x weekly - 1-2 sets - 10 reps - 2 hold - Ankle Plantar Flexion with Resistance  - 1 x daily - 7 x weekly - 1-2 sets - 10 reps - 2 hold - Long Sitting Ankle Eversion with Resistance (Mirrored)  - 1 x daily - 7 x weekly - 1-2 sets - 10 reps - 2 hold - Long Sitting Ankle Inversion with Resistance  - 1 x daily - 7 x weekly - 1-2 sets - 10 reps - 2 hold  ASSESSMENT:  CLINICAL IMPRESSION: Patient is a 23 y.o. female who was seen today for physical therapy evaluation and treatment for s/p right ankle fracture.  11/12/23: OPEN TREATMENT RIGHT PILON ANKLE FRACTURE WITH FIBULA. Pt presents 8 weeks s/p Sx. The r ankle is swollen and moderately painful with decreased ROM. A HEP was developed. Pt will benefit from skilled PT 2w8 to address impairments to optimize R ankle function with less pain.   OBJECTIVE IMPAIRMENTS: decreased activity tolerance, difficulty walking, decreased ROM, decreased strength, and pain.   ACTIVITY LIMITATIONS: bending, standing, squatting, and locomotion level  PARTICIPATION LIMITATIONS: meal prep, cleaning, shopping, community activity, and occupation  PERSONAL FACTORS: Time since onset of injury/illness/exacerbation are also affecting patient's functional outcome.   REHAB POTENTIAL: Good  CLINICAL DECISION MAKING: Evolving/moderate complexity  EVALUATION COMPLEXITY: Moderate   GOALS:   SHORT TERM GOALS: Target date: 01/29/24 Pt will be Ind in an initial HEP  Baseline:started Goal status: INITIAL  2.  Increase R ankle AROM for DF to neutral PF by 10d  Baseline: see flow sheets Goal status: INITIAL  LONG TERM GOALS: Target date: 03/18/24  Pt will be Ind in a final HEP to maintain achieved LOF  Baseline:  Goal status: INITIAL  2.  Increase R ankle AROM to with 85% of the L for proper function of the R ankle with ambulation Baseline: see flow  sheets Goal status: INITIAL  3.  Pt will demonstrate R single leg balance within 85% of the L for proper R ankle /LE function Baseline:  Goal status: INITIAL  4. Improve 5xSTS by MCID of 5" and by MCID of 40ft as indication of improved functional mobility  Baseline: To assess when pt is walking with gait pattern exhibiting min gait deficits Goal status: INITIAL  5.  Pt  will demonstrate a normalized gait pattern for a distance of 1000' for community mobility Baseline:  Goal status: INITIAL  6.  Pt will be able to ascend/descend steps in a reciprocating pattern c 1 HR assist or less for improved function of the R ankle Baseline: Step to pattern Goal status: INITIAL  7. Pt's LEFS score will improved by at least the MCID to 46% as indication of improved function Baseline:: 33%  Goal Status: INITIAL   PLAN:  PT FREQUENCY: 2x/week  PT DURATION: 8 weeks  PLANNED INTERVENTIONS: 97164- PT Re-evaluation, 97110-Therapeutic exercises, 97530- Therapeutic activity, 97112- Neuromuscular re-education, 97535- Self Care, 65784- Manual therapy, [phone]- Gait training, [phone]- Electrical stimulation (unattended), Patient/Family education, Balance training, Stair training, Taping, Dry Needling, Joint mobilization, Scar mobilization, Cryotherapy, and Moist heat  PLAN FOR NEXT SESSION: Assess response to HEP; progress therex as indicated; use of modalities,  manual therapy; and TPDN as indicated.  Debhora Titus MS, PT 01/13/24 6:44 PM

## 2024-01-13 ENCOUNTER — Ambulatory Visit: Attending: Orthopaedic Surgery

## 2024-01-13 ENCOUNTER — Other Ambulatory Visit: Payer: Self-pay

## 2024-01-13 DIAGNOSIS — M6281 Muscle weakness (generalized): Secondary | ICD-10-CM | POA: Insufficient documentation

## 2024-01-13 DIAGNOSIS — R262 Difficulty in walking, not elsewhere classified: Secondary | ICD-10-CM | POA: Diagnosis present

## 2024-01-13 DIAGNOSIS — M25571 Pain in right ankle and joints of right foot: Secondary | ICD-10-CM | POA: Diagnosis present

## 2024-01-18 ENCOUNTER — Ambulatory Visit: Admitting: Physical Therapy

## 2024-01-18 ENCOUNTER — Encounter: Payer: Self-pay | Admitting: Physical Therapy

## 2024-01-18 DIAGNOSIS — M6281 Muscle weakness (generalized): Secondary | ICD-10-CM

## 2024-01-18 DIAGNOSIS — R262 Difficulty in walking, not elsewhere classified: Secondary | ICD-10-CM

## 2024-01-18 DIAGNOSIS — M25571 Pain in right ankle and joints of right foot: Secondary | ICD-10-CM

## 2024-01-18 NOTE — Therapy (Signed)
 OUTPATIENT PHYSICAL THERAPY TREATMENT   Patient Name: Laurie [name] MRN: [id number] DOB:[birth year], 23 y.o., adult Today's Date: 01/18/2024  END OF SESSION:  PT End of Session - 01/18/24 0742     Visit Number 2    Number of Visits 16    Date for PT Re-Evaluation 03/18/24    Authorization Type [place] MEDICAID [employer]    Authorization - Visit Number 2    Authorization - Number of Visits 27    PT Start Time 0745    PT Stop Time 0825    PT Time Calculation (min) 40 min    Activity Tolerance Patient tolerated treatment well             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/28/2018   Prediabetes 02/08/2016   Vitamin D deficiency 12/21/2014   Past Surgical History:  Procedure Laterality Date   COLONOSCOPY WITH ESOPHAGOGASTRODUODENOSCOPY (EGD)  10/27/2022   ORIF ANKLE FRACTURE Right 11/12/2023   Procedure: OPEN TREATMENT RIGHT PILON ANKLE FRACTURE WITH FIBULA;  Surgeon: Terance Hart, MD;  Location: [facility] OR;  Service: Orthopedics;  Laterality: Right;   Patient Active Problem List   Diagnosis Date Noted   Endocrine disorder, unspecified 11/21/2019   Menstrual cramps 02/08/2016   Frequent headaches 12/21/2014   BMI (body mass index), pediatric, greater than or equal to 95% for age 47/07/2015    PCP: Georganna Skeans, MD  REFERRING PROVIDER: Terance Hart, MD  REFERRING DIAG: s/p right ankle fracture   THERAPY DIAG:  Pain in right ankle and joints of right foot  Muscle weakness (generalized)  Difficulty in walking, not elsewhere classified  Rationale for Evaluation and Treatment: Rehabilitation  ONSET DATE: 11/02/23 Fx ankle rock climbing with fall from 15 ft. Sx 11/12/23 8 weeks s/p  SUBJECTIVE:   SUBJECTIVE STATEMENT: Exercises going well, mostly having soreness at night. No issues since initial eval.    PERTINENT HISTORY: See PMH  PAIN:  Are you having pain? Yes: NPRS scale: 0/10 at rest, 3/10 at  night after daytine actvitiy Pain location: R ankle Pain description: ache Aggravating factors: prolonged time on feet Relieving factors: Cold pack  PRECAUTIONS: Other: WB in walking boot  RED FLAGS: None   WEIGHT BEARING RESTRICTIONS:  WB in walking boot  Call Dr. Donnie Mesa office for status of Wt bearing exs in standing out of the walking boot while at PT  Per note from evaluating PT: "4/4 call MD who states no CKC WB out of boot yet. Only open chain out of boot at this time"  FALLS:  Has patient fallen in last 6 months? Yes. 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 Susa Simmonds  OBJECTIVE:  Note: Objective measures were completed at Evaluation unless otherwise noted.  DIAGNOSTIC FINDINGS:  DG R Ankle 11/12/23 IMPRESSION: Intraoperative fluoroscopic guidance for ORIF of the distal tibia and fibula.  PATIENT SURVEYS:  LEFS 26/80=33% ability, 67% disability  COGNITION: Overall cognitive status: Within functional limits for tasks assessed     SENSATION: WFL  EDEMA:  Significant swelling present  MUSCLE LENGTH: Hamstrings: Right WNLs deg; Left WNLs deg Maisie Fus test: Right NT deg; Left NT deg  POSTURE: No  Significant postural limitations  PALPATION: TTP of the R peri-anlke  LOWER EXTREMITY ROM:  Bilat hips and knees WNLs Active ROM Right eval Left eval  Hip flexion    Hip extension    Hip abduction    Hip adduction    Hip internal rotation    Hip external rotation    Knee flexion    Knee extension    Ankle dorsiflexion A 10 lacking, P 0 A 13  Ankle plantarflexion A 20 A 40  Ankle inversion A 9 A 32  Ankle eversion A 12 A 21   (Blank rows = not  tested)  LOWER EXTREMITY MMT:  Bilat hip and kness 5/5. R ankle NT MMT Right eval Left eval  Hip flexion    Hip extension    Hip abduction    Hip adduction    Hip internal rotation    Hip external rotation    Knee flexion    Knee extension    Ankle dorsiflexion    Ankle plantarflexion    Ankle inversion    Ankle eversion     (Blank rows = not tested)  LOWER EXTREMITY SPECIAL TESTS:  NT  FUNCTIONAL TESTS: TBA when able to walk with min gait deficit  5 times sit to stand: TBA 2 minute walk test: TBA  GAIT: Distance walked: 200" Assistive device utilized:  walking boot Level of assistance: Modified independence Comments: WNLs c walking boot                                                                                                                                TREATMENT DATE:  [facility] Adult PT Treatment:                                                DATE: 01/18/24 Therapeutic Exercise: Seated calf stretch w/ strap 3x30sec Seated ankle pumps 2x12  Ankle circles 2x12 CW/CCW Open chain toe yoga 2x12 Open chain toe flexion 2x12 Yellow band PF x8 Yellow band inv x8 Yellow band ev x8 HEP update + education/handout  Self Care: Education on WB precautions per prior PT communication w/ MD office, icing    [facility] Adult PT Treatment:                                                DATE: 01/13/24 Therapeutic Exercise: Developed, instructed in, and pt completed therex as noted in HEP  Self Care: RICE     PATIENT EDUCATION:  Education details: rationale for interventions, HEP  Person educated: Patient Education method: Explanation, Demonstration, Tactile cues, Verbal cues Education comprehension: verbalized understanding, returned demonstration, verbal cues required, tactile cues required, and needs further  education     HOME EXERCISE PROGRAM: Access Code: [email or web address] URL: [email or web address] Date: 01/18/2024 Prepared by: Fransisco Hertz  Exercises -  Long Sitting Calf Stretch with Strap  - 1-2 x daily - 7 x weekly - 1 sets - 3-5 reps - 30 hold - Long Sitting Ankle Pumps  - 1-2 x daily - 7 x weekly - 1-2 sets - 10 reps - Supine Ankle Circles  - 1-2 x daily - 7 x weekly - 1-2 sets - 10 reps - Towel Scrunches  - 1 x daily - 7 x weekly - 1 sets - 10 reps - Toe Yoga - Alternating Great Toe and Lesser Toe Extension  - 1 x daily - 7 x weekly - 1 sets - 10 reps - Ankle Plantar Flexion with Resistance  - 1 x daily - 7 x weekly - 1-2 sets - 10 reps - 2 hold - Long Sitting Ankle Eversion with Resistance (Mirrored)  - 1 x daily - 7 x weekly - 1-2 sets - 10 reps - 2 hold - Long Sitting Ankle Inversion with Resistance  - 1 x daily - 7 x weekly - 1-2 sets - 10 reps - 2 hold  ASSESSMENT:  CLINICAL IMPRESSION: 01/18/2024 Pt arrives w/o pain, no issues since initial eval. Today focusing on HEP review which they do well with. Some muscle fatigue/soreness but no increase in overt pain, no adverse events, cues as above. Recommend continuing along current POC in order to address relevant deficits and improve functional tolerance. Pt departs today's session in no acute distress, all voiced questions/concerns addressed appropriately from PT perspective.    Per eval - Patient is a 23 y.o. female who was seen today for physical therapy evaluation and treatment for s/p right ankle fracture.  11/12/23: OPEN TREATMENT RIGHT PILON ANKLE FRACTURE WITH FIBULA. Pt presents 8 weeks s/p Sx. The r ankle is swollen and moderately painful with decreased ROM. A HEP was developed. Pt will benefit from skilled PT 2w8 to address impairments to optimize R ankle function with less pain.   OBJECTIVE IMPAIRMENTS: decreased activity tolerance, difficulty walking, decreased ROM, decreased strength, and pain.   ACTIVITY LIMITATIONS: bending, standing, squatting, and locomotion level  PARTICIPATION LIMITATIONS: meal prep, cleaning, shopping, community activity, and occupation  PERSONAL  FACTORS: Time since onset of injury/illness/exacerbation are also affecting patient's functional outcome.   REHAB POTENTIAL: Good  CLINICAL DECISION MAKING: Evolving/moderate complexity  EVALUATION COMPLEXITY: Moderate   GOALS:   SHORT TERM GOALS: Target date: 01/29/24 Pt will be Ind in an initial HEP  Baseline:started Goal status: INITIAL  2.  Increase R ankle AROM for DF to neutral PF by 10d  Baseline: see flow sheets Goal status: INITIAL  LONG TERM GOALS: Target date: 03/18/24  Pt will be Ind in a final HEP to maintain achieved LOF  Baseline:  Goal status: INITIAL  2.  Increase R ankle AROM to with 85% of the L for proper function of the R ankle with ambulation Baseline: see flow sheets Goal status: INITIAL  3.  Pt will demonstrate R single leg balance within 85% of the L for proper R ankle /LE function Baseline:  Goal status: INITIAL  4. Improve 5xSTS by MCID of 5" and by MCID of 27ft as indication of improved functional mobility  Baseline: To assess when pt is walking with gait pattern exhibiting min gait deficits Goal status: INITIAL  5.  Pt  will demonstrate a normalized gait pattern for a distance of 1000' for  community mobility Baseline:  Goal status: INITIAL  6.  Pt will be able to ascend/descend steps in a reciprocating pattern c 1 HR assist or less for improved function of the R ankle Baseline: Step to pattern Goal status: INITIAL  7. Pt's LEFS score will improved by at least the MCID to 46% as indication of improved function Baseline:: 33%  Goal Status: INITIAL   PLAN:  PT FREQUENCY: 2x/week  PT DURATION: 8 weeks  PLANNED INTERVENTIONS: 97164- PT Re-evaluation, 97110-Therapeutic exercises, 97530- Therapeutic activity, 97112- Neuromuscular re-education, 97535- Self Care, 16109- Manual therapy, [phone]- Gait training, [phone]- Electrical stimulation (unattended), Patient/Family education, Balance training, Stair training, Taping, Dry Needling, Joint  mobilization, Scar mobilization, Cryotherapy, and Moist heat  PLAN FOR NEXT SESSION: Assess response to HEP; progress therex as indicated; use of modalities, manual therapy; and TPDN as indicated.  Ashley Murrain PT, DPT 01/18/2024 8:30 AM

## 2024-01-19 NOTE — Therapy (Signed)
 OUTPATIENT PHYSICAL THERAPY TREATMENT   Patient Name: Laurie [name] MRN: [id number] DOB:06-Oct-2001, 23 y.o., adult Today's Date: 01/20/2024  END OF SESSION:  PT End of Session - 01/20/24 0851     Visit Number 3    Number of Visits 16    Date for PT Re-Evaluation 03/18/24    Authorization Type [place] MEDICAID [employer]    Authorization - Visit Number 3    Authorization - Number of Visits 27    PT Start Time 0800    PT Stop Time 0850    PT Time Calculation (min) 50 min    Activity Tolerance Patient tolerated treatment well    Behavior During Therapy [facility] for tasks assessed/performed              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/28/2018   Prediabetes 02/08/2016   Vitamin D deficiency 12/21/2014   Past Surgical History:  Procedure Laterality Date   COLONOSCOPY WITH ESOPHAGOGASTRODUODENOSCOPY (EGD)  10/27/2022   ORIF ANKLE FRACTURE Right 11/12/2023   Procedure: OPEN TREATMENT RIGHT PILON ANKLE FRACTURE WITH FIBULA;  Surgeon: Terance Hart, MD;  Location: [facility] OR;  Service: Orthopedics;  Laterality: Right;   Patient Active Problem List   Diagnosis Date Noted   Endocrine disorder, unspecified 11/21/2019   Menstrual cramps 02/08/2016   Frequent headaches 12/21/2014   BMI (body mass index), pediatric, greater than or equal to 95% for age 76/07/2015    PCP: Georganna Skeans, MD  REFERRING PROVIDER: Terance Hart, MD  REFERRING DIAG: s/p right ankle fracture   THERAPY DIAG:  Pain in right ankle and joints of right foot  Muscle weakness (generalized)  Difficulty in walking, not elsewhere classified  Rationale for Evaluation and Treatment: Rehabilitation  ONSET DATE: 11/02/23 Fx ankle rock climbing with fall from 15 ft. Sx 11/12/23 8 weeks s/p  SUBJECTIVE:   SUBJECTIVE STATEMENT: Pt reports the stiffness is improving. Pt notes she using a cold pack and elevating her R ankle has been helpful for managing  the pain and swelling.   PERTINENT HISTORY: See PMH  PAIN:  Are you having pain? Yes: NPRS scale: 0/10 at rest, 3/10 at night after daytine actvitiy Pain location: R ankle Pain description: ache Aggravating factors: prolonged time on feet Relieving factors: Cold pack  PRECAUTIONS: Other: WB in walking boot  RED FLAGS: None   WEIGHT BEARING RESTRICTIONS:  WB in walking boot  Call Dr. Donnie Mesa office for status of Wt bearing exs in standing out of the walking boot while at PT  Per note from evaluating PT: "4/4 call MD who states no CKC WB out of boot yet. Only open chain out of boot at this time"  FALLS:  Has patient fallen in last 6 months? Yes. 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 Susa Simmonds  OBJECTIVE:  Note: Objective measures were completed at Evaluation unless otherwise noted.  DIAGNOSTIC FINDINGS:  DG R Ankle 11/12/23 IMPRESSION: Intraoperative fluoroscopic guidance for ORIF of the distal tibia and fibula.  PATIENT SURVEYS:  LEFS 26/80=33% ability, 67% disability  COGNITION: Overall cognitive status: Within functional limits for tasks assessed     SENSATION: WFL  EDEMA:  Significant swelling present  MUSCLE LENGTH: Hamstrings: Right WNLs deg; Left WNLs deg Maisie Fus test: Right NT deg; Left NT deg  POSTURE: No Significant postural limitations  PALPATION: TTP of the R peri-anlke  LOWER EXTREMITY ROM:  Bilat hips and knees WNLs Active ROM Right eval Left eval RT 01/20/24  Hip flexion     Hip extension     Hip abduction     Hip adduction     Hip internal rotation     Hip external rotation     Knee flexion     Knee extension     Ankle dorsiflexion  A 10 lacking, P 0 A 13 A 3 lacking  Ankle plantarflexion A 20 A 40 32 A  Ankle inversion A 9 A 32 A 18  Ankle eversion A 12 A 21 A 14   (Blank rows = not tested)  LOWER EXTREMITY MMT:  Bilat hip and kness 5/5. R ankle NT MMT Right eval Left eval  Hip flexion    Hip extension    Hip abduction    Hip adduction    Hip internal rotation    Hip external rotation    Knee flexion    Knee extension    Ankle dorsiflexion    Ankle plantarflexion    Ankle inversion    Ankle eversion     (Blank rows = not tested)  LOWER EXTREMITY SPECIAL TESTS:  NT  FUNCTIONAL TESTS: TBA when able to walk with min gait deficit  5 times sit to stand: TBA 2 minute walk test: TBA  GAIT: Distance walked: 200" Assistive device utilized:  walking boot Level of assistance: Modified independence Comments: WNLs c walking boot                                                                                                                                TREATMENT DATE:  [facility] Adult PT Treatment:                                                DATE: 01/20/24 Therapeutic Exercise: Seated calf stretch w/ strap 3x30sec Seated ankle pumps 2x12  Ankle circles 2x12 CW/CCW Seated calf stretch w/ strap 3x30sec Open chain toe yoga 2x12 Open chain toe flexion 2x12 Seated heel raises/toe lifts 2x10 Yellow band PF 2x10 Yellow band inv 2x10 Yellow band ev 2x10 Yellow band DF 2x10 Modalities: Cold pack with elevation x10 mins  [facility] Adult PT Treatment:                                                DATE: 01/18/24 Therapeutic Exercise: Seated calf stretch w/ strap 3x30sec Seated ankle pumps 2x12  Ankle circles 2x12 CW/CCW Open chain toe yoga 2x12 Open chain toe flexion 2x12 Yellow band PF x8 Yellow band inv x8 Yellow band ev x8 HEP update + education/handout  Self Care: Education on WB precautions per prior PT communication w/ MD office, icing    [facility] Adult PT Treatment:                                                 DATE: 01/13/24 Therapeutic Exercise: Developed, instructed in, and pt completed therex as noted in HEP  Self Care: RICE     PATIENT EDUCATION:  Education details: rationale for interventions, HEP  Person educated: Patient Education method: Explanation, Demonstration, Tactile cues, Verbal cues Education comprehension: verbalized understanding, returned demonstration, verbal cues required, tactile cues required, and needs further education     HOME EXERCISE PROGRAM: Access Code: [email or web address] URL: [email or web address] Date: 01/18/2024 Prepared by: Fransisco Hertz  Exercises - Long Sitting Calf Stretch with Strap  - 1-2 x daily - 7 x weekly - 1 sets - 3-5 reps - 30 hold - Long Sitting Ankle Pumps  - 1-2 x daily - 7 x weekly - 1-2 sets - 10 reps - Supine Ankle Circles  - 1-2 x daily - 7 x weekly - 1-2 sets - 10 reps - Towel Scrunches  - 1 x daily - 7 x weekly - 1 sets - 10 reps - Toe Yoga - Alternating Great Toe and Lesser Toe Extension  - 1 x daily - 7 x weekly - 1 sets - 10 reps - Ankle Plantar Flexion with Resistance  - 1 x daily - 7 x weekly - 1-2 sets - 10 reps - 2 hold - Long Sitting Ankle Eversion with Resistance (Mirrored)  - 1 x daily - 7 x weekly - 1-2 sets - 10 reps - 2 hold - Long Sitting Ankle Inversion with Resistance  - 1 x daily - 7 x weekly - 1-2 sets - 10 reps - 2 hold  ASSESSMENT:  CLINICAL IMPRESSION: 01/20/24: PT was completed for PROM, AROM, strengthening therex for the R ankle and foot. PROM of the R ankle was reassessed and it has made good progress since the initial visit. Pt demonstrates good understanding of her HEP. At end of session, a cold pack with elevation was provided for pain and swelling management. Pt is making appropriate progress. Pt sees the surgeon on 01/25/24, and will progress rehab per his instructions following this appt. Pt will continue to benefit from skilled PT to address impairments for improved R ankle function.   Per  eval - Patient is a 23 y.o. female who was seen today for physical therapy evaluation and treatment for s/p right ankle fracture.  11/12/23: OPEN TREATMENT RIGHT PILON ANKLE FRACTURE WITH FIBULA. Pt presents 8 weeks s/p Sx. The r ankle is swollen and moderately painful with decreased ROM. A HEP was developed. Pt will benefit from skilled PT 2w8 to address impairments to optimize R ankle function with less pain.   OBJECTIVE IMPAIRMENTS: decreased activity tolerance, difficulty walking, decreased ROM, decreased strength, and pain.   ACTIVITY LIMITATIONS: bending, standing, squatting, and locomotion level  PARTICIPATION LIMITATIONS: meal prep, cleaning, shopping, community activity, and occupation  PERSONAL FACTORS: Time since onset of injury/illness/exacerbation are also affecting patient's functional outcome.   REHAB POTENTIAL: Good  CLINICAL DECISION MAKING: Evolving/moderate complexity  EVALUATION COMPLEXITY:  Moderate   GOALS:   SHORT TERM GOALS: Target date: 01/29/24 Pt will be Ind in an initial HEP  Baseline:started Goal status: INITIAL  2.  Increase R ankle AROM for DF to neutral PF by 10d  Baseline: see flow sheets Goal status: INITIAL  LONG TERM GOALS: Target date: 03/18/24  Pt will be Ind in a final HEP to maintain achieved LOF  Baseline:  Goal status: INITIAL  2.  Increase R ankle AROM to with 85% of the L for proper function of the R ankle with ambulation Baseline: see flow sheets Goal status: INITIAL  3.  Pt will demonstrate R single leg balance within 85% of the L for proper R ankle /LE function Baseline:  Goal status: INITIAL  4. Improve 5xSTS by MCID of 5" and by MCID of 78ft as indication of improved functional mobility  Baseline: To assess when pt is walking with gait pattern exhibiting min gait deficits Goal status: INITIAL  5.  Pt  will demonstrate a normalized gait pattern for a distance of 1000' for community mobility Baseline:  Goal status:  INITIAL  6.  Pt will be able to ascend/descend steps in a reciprocating pattern c 1 HR assist or less for improved function of the R ankle Baseline: Step to pattern Goal status: INITIAL  7. Pt's LEFS score will improved by at least the MCID to 46% as indication of improved function Baseline:: 33%  Goal Status: INITIAL   PLAN:  PT FREQUENCY: 2x/week  PT DURATION: 8 weeks  PLANNED INTERVENTIONS: 97164- PT Re-evaluation, 97110-Therapeutic exercises, 97530- Therapeutic activity, 97112- Neuromuscular re-education, 97535- Self Care, 16109- Manual therapy, [phone]- Gait training, [phone]- Electrical stimulation (unattended), Patient/Family education, Balance training, Stair training, Taping, Dry Needling, Joint mobilization, Scar mobilization, Cryotherapy, and Moist heat  PLAN FOR NEXT SESSION: Assess response to HEP; progress therex as indicated; use of modalities, manual therapy; and TPDN as indicated.  Yancy Hascall MS, PT 01/20/24 9:03 AM

## 2024-01-20 ENCOUNTER — Ambulatory Visit

## 2024-01-20 DIAGNOSIS — M25571 Pain in right ankle and joints of right foot: Secondary | ICD-10-CM

## 2024-01-20 DIAGNOSIS — R262 Difficulty in walking, not elsewhere classified: Secondary | ICD-10-CM

## 2024-01-20 DIAGNOSIS — M6281 Muscle weakness (generalized): Secondary | ICD-10-CM

## 2024-01-25 DIAGNOSIS — S82871A Displaced pilon fracture of right tibia, initial encounter for closed fracture: Secondary | ICD-10-CM | POA: Diagnosis not present

## 2024-01-26 ENCOUNTER — Encounter: Payer: Self-pay | Admitting: Physical Therapy

## 2024-01-26 ENCOUNTER — Ambulatory Visit: Admitting: Physical Therapy

## 2024-01-26 DIAGNOSIS — M6281 Muscle weakness (generalized): Secondary | ICD-10-CM

## 2024-01-26 DIAGNOSIS — R262 Difficulty in walking, not elsewhere classified: Secondary | ICD-10-CM

## 2024-01-26 DIAGNOSIS — M25571 Pain in right ankle and joints of right foot: Secondary | ICD-10-CM | POA: Diagnosis not present

## 2024-01-26 NOTE — Therapy (Signed)
 OUTPATIENT PHYSICAL THERAPY TREATMENT   Patient Name: Laurie [name] MRN: [id number] DOB:Nov 30, 2000, 23 y.o., adult Today's Date: 01/26/2024  END OF SESSION:  PT End of Session - 01/26/24 0737     Visit Number 4    Number of Visits 16    Date for PT Re-Evaluation 03/18/24    Authorization Type [place] MEDICAID [employer]    Authorization - Visit Number 4    Authorization - Number of Visits 27    PT Start Time [phone]    PT Stop Time 0835   cold pack   PT Time Calculation (min) 53 min    Activity Tolerance Patient tolerated treatment well              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/28/2018   Prediabetes 02/08/2016   Vitamin D deficiency 12/21/2014   Past Surgical History:  Procedure Laterality Date   COLONOSCOPY WITH ESOPHAGOGASTRODUODENOSCOPY (EGD)  10/27/2022   ORIF ANKLE FRACTURE Right 11/12/2023   Procedure: OPEN TREATMENT RIGHT PILON ANKLE FRACTURE WITH FIBULA;  Surgeon: Donnamarie Gables, MD;  Location: [facility] OR;  Service: Orthopedics;  Laterality: Right;   Patient Active Problem List   Diagnosis Date Noted   Endocrine disorder, unspecified 11/21/2019   Menstrual cramps 02/08/2016   Frequent headaches 12/21/2014   BMI (body mass index), pediatric, greater than or equal to 95% for age 79/07/2015    PCP: Abraham Abo, MD  REFERRING PROVIDER: Donnamarie Gables, MD  REFERRING DIAG: s/p right ankle fracture   THERAPY DIAG:  Pain in right ankle and joints of right foot  Muscle weakness (generalized)  Difficulty in walking, not elsewhere classified  Rationale for Evaluation and Treatment: Rehabilitation  ONSET DATE: 11/02/23 Fx ankle rock climbing with fall from 15 ft. Sx 11/12/23 8 weeks s/p  SUBJECTIVE:   SUBJECTIVE STATEMENT: 01/26/2024 Pt states physician visit went well, took XR and states they were cleared to WB outside of boot. Also provided w/ ASO brace. 2/10 at present due to more walking  over the weekend but felt good after last session.    PERTINENT HISTORY: See PMH  PAIN:  Are you having pain? Yes: NPRS scale: 2/10 Pain location: R ankle Pain description: ache Aggravating factors: prolonged time on feet Relieving factors: Cold pack  PRECAUTIONS: Other: WB in walking boot  RED FLAGS: None   WEIGHT BEARING RESTRICTIONS:  WB in walking boot  Call Dr. Verneita Goldmann office for status of Wt bearing exs in standing out of the walking boot while at PT  Per note from evaluating PT: "4/4 call MD who states no CKC WB out of boot yet. Only open chain out of boot at this time" Per pt report after MD visit 01/26/24 cleared to WBAT outside boot  FALLS:  Has patient fallen in last 6 months? Yes. 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 Hulda Mage  OBJECTIVE:  Note: Objective measures were completed at Evaluation unless otherwise noted.  DIAGNOSTIC FINDINGS:  DG R Ankle 11/12/23 IMPRESSION: Intraoperative fluoroscopic guidance for ORIF of the distal tibia and fibula.  PATIENT SURVEYS:  LEFS 26/80=33% ability, 67% disability  COGNITION: Overall cognitive status: Within functional limits  for tasks assessed     SENSATION: WFL  EDEMA:  Significant swelling present  MUSCLE LENGTH: Hamstrings: Right WNLs deg; Left WNLs deg Maisie Fus test: Right NT deg; Left NT deg  POSTURE: No Significant postural limitations  PALPATION: TTP of the R peri-anlke  LOWER EXTREMITY ROM:  Bilat hips and knees WNLs Active ROM Right eval Left eval RT 01/20/24  Hip flexion     Hip extension     Hip abduction     Hip adduction     Hip internal rotation     Hip external rotation     Knee  flexion     Knee extension     Ankle dorsiflexion A 10 lacking, P 0 A 13 A 3 lacking  Ankle plantarflexion A 20 A 40 32 A  Ankle inversion A 9 A 32 A 18  Ankle eversion A 12 A 21 A 14   (Blank rows = not tested)  LOWER EXTREMITY MMT:  Bilat hip and kness 5/5. R ankle NT MMT Right eval Left eval  Hip flexion    Hip extension    Hip abduction    Hip adduction    Hip internal rotation    Hip external rotation    Knee flexion    Knee extension    Ankle dorsiflexion    Ankle plantarflexion    Ankle inversion    Ankle eversion     (Blank rows = not tested)  LOWER EXTREMITY SPECIAL TESTS:  NT  FUNCTIONAL TESTS: TBA when able to walk with min gait deficit  5 times sit to stand: TBA 2 minute walk test: TBA  GAIT: Distance walked: 200" Assistive device utilized:  walking boot Level of assistance: Modified independence Comments: WNLs c walking boot                                                                                                                                TREATMENT DATE:  [facility] Adult PT Treatment:                                                DATE: 01/26/24 Therapeutic Exercise: Ankle circles CW/CCW x12  Ankle pump full ROM x12 Yellow band PF x15 Yellow band DF x15  Neuromuscular re-ed: Toe flexion 2x10 cues for tripod foot  Isolated hallux ext 2x10 cues for tripod Isolated lateral toe ext 2x10  Therapeutic Activity: AP weight shift RW 2x8 cues for comfortable mechanics ML weight shift RW 2x8  Education/discussion re: gradual progression of activities per physician recommendation, monitoring symptoms and appropriate activity modification, rationale for interventions and relevant anatomy/physiology   Modalities: 10 min cold pack R ankle, LE elevated    [facility] Adult PT Treatment:  DATE: 01/20/24 Therapeutic Exercise: Seated calf stretch w/ strap 3x30sec Seated ankle pumps 2x12  Ankle circles 2x12  CW/CCW Seated calf stretch w/ strap 3x30sec Open chain toe yoga 2x12 Open chain toe flexion 2x12 Seated heel raises/toe lifts 2x10 Yellow band PF 2x10 Yellow band inv 2x10 Yellow band ev 2x10 Yellow band DF 2x10 Modalities: Cold pack with elevation x10 mins  [facility] Adult PT Treatment:                                                DATE: 01/18/24 Therapeutic Exercise: Seated calf stretch w/ strap 3x30sec Seated ankle pumps 2x12  Ankle circles 2x12 CW/CCW Open chain toe yoga 2x12 Open chain toe flexion 2x12 Yellow band PF x8 Yellow band inv x8 Yellow band ev x8 HEP update + education/handout  Self Care: Education on WB precautions per prior PT communication w/ MD office, icing   PATIENT EDUCATION:  Education details: rationale for interventions, HEP  Person educated: Patient Education method: Explanation, Demonstration, Tactile cues, Verbal cues Education comprehension: verbalized understanding, returned demonstration, verbal cues required, tactile cues required, and needs further education     HOME EXERCISE PROGRAM: Access Code: [email or web address] URL: [email or web address] Date: 01/18/2024 Prepared by: Fransisco Hertz  Exercises - Long Sitting Calf Stretch with Strap  - 1-2 x daily - 7 x weekly - 1 sets - 3-5 reps - 30 hold - Long Sitting Ankle Pumps  - 1-2 x daily - 7 x weekly - 1-2 sets - 10 reps - Supine Ankle Circles  - 1-2 x daily - 7 x weekly - 1-2 sets - 10 reps - Towel Scrunches  - 1 x daily - 7 x weekly - 1 sets - 10 reps - Toe Yoga - Alternating Great Toe and Lesser Toe Extension  - 1 x daily - 7 x weekly - 1 sets - 10 reps - Ankle Plantar Flexion with Resistance  - 1 x daily - 7 x weekly - 1-2 sets - 10 reps - 2 hold - Long Sitting Ankle Eversion with Resistance (Mirrored)  - 1 x daily - 7 x weekly - 1-2 sets - 10 reps - 2 hold - Long Sitting Ankle Inversion with Resistance  - 1 x daily - 7 x weekly - 1-2 sets - 10 reps - 2 hold  ASSESSMENT:  CLINICAL  IMPRESSION: 01/26/2024 Pt arrives w/ mild increase in pain after increased activities over the weekend but overall continues to endorse steady progress, states physician cleared them to [facility] outside of boot. Today we progress motor control exercises for foot musculature, and introduce gentle WB outside of boot with UE support. Pt tolerates this quite well and endorses gradual resolution of pain as session goes on, no adverse events. Cold pack at end of session w/ good response. Recommend continuing along current POC in order to address relevant deficits and improve functional tolerance. Pt departs today's session in no acute distress, all voiced questions/concerns addressed appropriately from PT perspective.     Per eval - Patient is a 23 y.o. female who was seen today for physical therapy evaluation and treatment for s/p right ankle fracture.  11/12/23: OPEN TREATMENT RIGHT PILON ANKLE FRACTURE WITH FIBULA. Pt presents 8 weeks s/p Sx. The r ankle is swollen and moderately painful with decreased ROM. A HEP was developed. Pt will benefit from skilled PT 2w8 to address impairments  to optimize R ankle function with less pain.   OBJECTIVE IMPAIRMENTS: decreased activity tolerance, difficulty walking, decreased ROM, decreased strength, and pain.   ACTIVITY LIMITATIONS: bending, standing, squatting, and locomotion level  PARTICIPATION LIMITATIONS: meal prep, cleaning, shopping, community activity, and occupation  PERSONAL FACTORS: Time since onset of injury/illness/exacerbation are also affecting patient's functional outcome.   REHAB POTENTIAL: Good  CLINICAL DECISION MAKING: Evolving/moderate complexity  EVALUATION COMPLEXITY: Moderate   GOALS:   SHORT TERM GOALS: Target date: 01/29/24 Pt will be Ind in an initial HEP  Baseline:started Goal status: INITIAL  2.  Increase R ankle AROM for DF to neutral PF by 10d  Baseline: see flow sheets Goal status: INITIAL  LONG TERM GOALS: Target date:  03/18/24  Pt will be Ind in a final HEP to maintain achieved LOF  Baseline:  Goal status: INITIAL  2.  Increase R ankle AROM to with 85% of the L for proper function of the R ankle with ambulation Baseline: see flow sheets Goal status: INITIAL  3.  Pt will demonstrate R single leg balance within 85% of the L for proper R ankle /LE function Baseline:  Goal status: INITIAL  4. Improve 5xSTS by MCID of 5" and by MCID of 56ft as indication of improved functional mobility  Baseline: To assess when pt is walking with gait pattern exhibiting min gait deficits Goal status: INITIAL  5.  Pt  will demonstrate a normalized gait pattern for a distance of 1000' for community mobility Baseline:  Goal status: INITIAL  6.  Pt will be able to ascend/descend steps in a reciprocating pattern c 1 HR assist or less for improved function of the R ankle Baseline: Step to pattern Goal status: INITIAL  7. Pt's LEFS score will improved by at least the MCID to 46% as indication of improved function Baseline:: 33%  Goal Status: INITIAL   PLAN:  PT FREQUENCY: 2x/week  PT DURATION: 8 weeks  PLANNED INTERVENTIONS: 97164- PT Re-evaluation, 97110-Therapeutic exercises, 97530- Therapeutic activity, 97112- Neuromuscular re-education, 97535- Self Care, 78295- Manual therapy, [phone]- Gait training, [phone]- Electrical stimulation (unattended), Patient/Family education, Balance training, Stair training, Taping, Dry Needling, Joint mobilization, Scar mobilization, Cryotherapy, and Moist heat  PLAN FOR NEXT SESSION: Assess response to HEP; progress therex as indicated; use of modalities, manual therapy; and TPDN as indicated.  Lovett Ruck PT, DPT 01/26/2024 8:36 AM

## 2024-01-27 ENCOUNTER — Ambulatory Visit: Admitting: Obstetrics

## 2024-01-27 ENCOUNTER — Other Ambulatory Visit (HOSPITAL_COMMUNITY)
Admission: RE | Admit: 2024-01-27 | Discharge: 2024-01-27 | Disposition: A | Discharge status: Home / Self Care | Source: Ambulatory Visit | Attending: Physician Assistant | Admitting: Physician Assistant

## 2024-01-27 ENCOUNTER — Encounter: Payer: Self-pay | Admitting: Obstetrics

## 2024-01-27 VITALS — BP 107/69 | HR 73 | Wt 163.4 lb

## 2024-01-27 DIAGNOSIS — Z01419 Encounter for gynecological examination (general) (routine) without abnormal findings: Secondary | ICD-10-CM | POA: Diagnosis not present

## 2024-01-27 DIAGNOSIS — Z1501 Genetic susceptibility to malignant neoplasm of breast: Secondary | ICD-10-CM

## 2024-01-27 DIAGNOSIS — Z1339 Encounter for screening examination for other mental health and behavioral disorders: Secondary | ICD-10-CM

## 2024-01-27 DIAGNOSIS — Z131 Encounter for screening for diabetes mellitus: Secondary | ICD-10-CM | POA: Diagnosis not present

## 2024-01-27 DIAGNOSIS — J301 Allergic rhinitis due to pollen: Secondary | ICD-10-CM | POA: Diagnosis not present

## 2024-01-27 MED ORDER — LORATADINE 10 MG PO TABS: 10.0000 mg | ORAL_TABLET | Freq: Every day | ORAL | 11 refills | Status: AC

## 2024-01-27 NOTE — Progress Notes (Signed)
 Pt presents for annual and bc consult. Pt would like to make an appointment to get the nexpalnon. Pt declines all std testing.

## 2024-01-27 NOTE — Therapy (Signed)
 OUTPATIENT PHYSICAL THERAPY TREATMENT   Patient Name: Keyry Iracheta MRN: [id number] DOB:2001/08/27, 23 y.o., adult Today's Date: 01/28/2024  END OF SESSION:  PT End of Session - 01/28/24 0909     Visit Number 5    Number of Visits 16    Date for PT Re-Evaluation 03/18/24    Authorization Type [place] MEDICAID [employer]    Authorization - Visit Number 5    Authorization - Number of Visits 27    PT Start Time 0901    PT Stop Time 0940    PT Time Calculation (min) 39 min    Activity Tolerance Patient tolerated treatment well    Behavior During Therapy WFL for tasks assessed/performed               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/28/2018   Prediabetes 02/08/2016   Vitamin D deficiency 12/21/2014   Past Surgical History:  Procedure Laterality Date   COLONOSCOPY WITH ESOPHAGOGASTRODUODENOSCOPY (EGD)  10/27/2022   ORIF ANKLE FRACTURE Right 11/12/2023   Procedure: OPEN TREATMENT RIGHT PILON ANKLE FRACTURE WITH FIBULA;  Surgeon: Terance Hart, MD;  Location: [facility] OR;  Service: Orthopedics;  Laterality: Right;   Patient Active Problem List   Diagnosis Date Noted   Endocrine disorder, unspecified 11/21/2019   Menstrual cramps 02/08/2016   Frequent headaches 12/21/2014   BMI (body mass index), pediatric, greater than or equal to 95% for age 70/07/2015    PCP: Georganna Skeans, MD  REFERRING PROVIDER: Terance Hart, MD  REFERRING DIAG: s/p right ankle fracture   THERAPY DIAG:  Pain in right ankle and joints of right foot  Muscle weakness (generalized)  Difficulty in walking, not elsewhere classified  Rationale for Evaluation and Treatment: Rehabilitation  ONSET DATE: 11/02/23 Fx ankle rock climbing with fall from 15 ft. Sx 11/12/23 8 weeks s/p  SUBJECTIVE:   SUBJECTIVE STATEMENT: 01/28/2024 Pt reports she is walking primarily out of the walking boot at home, but will wear it for community ambulation.     PERTINENT HISTORY: See PMH  PAIN:  Are you having pain? Yes: NPRS scale: 2/10 Pain location: R ankle Pain description: ache Aggravating factors: prolonged time on feet Relieving factors: Cold pack  PRECAUTIONS: Other: WB in walking boot  RED FLAGS: None   WEIGHT BEARING RESTRICTIONS:  WB in walking boot  Call Dr. Donnie Mesa office for status of Wt bearing exs in standing out of the walking boot while at PT  Per note from evaluating PT: "4/4 call MD who states no CKC WB out of boot yet. Only open chain out of boot at this time" Per pt report after MD visit 01/26/24 cleared to WBAT outside boot  FALLS:  Has patient fallen in last 6 months? Yes. 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 Susa Simmonds  OBJECTIVE:  Note: Objective measures were completed at Evaluation unless otherwise noted.  DIAGNOSTIC FINDINGS:  DG R Ankle 11/12/23 IMPRESSION: Intraoperative fluoroscopic guidance for ORIF of the distal tibia and fibula.  PATIENT SURVEYS:  LEFS 26/80=33% ability, 67% disability  COGNITION: Overall cognitive status: Within functional limits for tasks assessed     SENSATION: WFL  EDEMA:  Significant swelling present  MUSCLE LENGTH: Hamstrings: Right WNLs deg; Left WNLs deg Maisie Fus test: Right NT deg; Left NT deg  POSTURE: No Significant postural limitations  PALPATION: TTP of the R peri-anlke  LOWER EXTREMITY ROM:  Bilat hips and knees WNLs Active ROM Right eval Left eval RT 01/20/24 RT 01/28/24  Hip flexion      Hip extension      Hip abduction      Hip adduction      Hip internal rotation      Hip external rotation      Knee flexion      Knee extension       Ankle dorsiflexion A 10 lacking, P 0 A 13 A 3 lacking 0 A  Ankle plantarflexion A 20 A 40 32 A 32 A  Ankle inversion A 9 A 32 A 18   Ankle eversion A 12 A 21 A 14    (Blank rows = not tested)  LOWER EXTREMITY MMT:  Bilat hip and kness 5/5. R ankle NT MMT Right eval Left eval  Hip flexion    Hip extension    Hip abduction    Hip adduction    Hip internal rotation    Hip external rotation    Knee flexion    Knee extension    Ankle dorsiflexion    Ankle plantarflexion    Ankle inversion    Ankle eversion     (Blank rows = not tested)  LOWER EXTREMITY SPECIAL TESTS:  NT  FUNCTIONAL TESTS: TBA when able to walk with min gait deficit  5 times sit to stand: TBA 2 minute walk test: TBA  GAIT: Distance walked: 200" Assistive device utilized:  walking boot Level of assistance: Modified independence Comments: WNLs c walking boot                                                                                                                                TREATMENT DATE:  [facility] Adult PT Treatment:                                                DATE: 01/28/24 Therapeutic Exercise: Towel scrunches Toe yoga Ankle inv/ev on towel Ankle DF/PF seated c 10# Ankle circles CW/CCW x12  Standing gastroc and soleus stretches 2x ech 30 sec Therapeutic Activity: Ankle DF/PF seated c 10# Tandem standing Modalities: 10 min cold pack R ankle, LE elevated  [facility] Adult PT Treatment:                                                DATE: 01/26/24 Therapeutic Exercise: Ankle circles CW/CCW x12  Ankle pump full ROM  x12 Yellow band PF x15 Yellow band DF x15  Neuromuscular re-ed: Toe flexion 2x10 cues for tripod foot  Isolated hallux ext 2x10 cues for tripod Isolated lateral toe ext 2x10  Therapeutic Activity: AP weight shift RW 2x8 cues for comfortable mechanics ML weight shift RW 2x8  Education/discussion re: gradual progression of activities per physician recommendation, monitoring  symptoms and appropriate activity modification, rationale for interventions and relevant anatomy/physiology   Modalities: 10 min cold pack R ankle, LE elevated    [facility] Adult PT Treatment:                                                DATE: 01/20/24 Therapeutic Exercise: Seated calf stretch w/ strap 3x30sec Seated ankle pumps 2x12  Ankle circles 2x12 CW/CCW Seated calf stretch w/ strap 3x30sec Open chain toe yoga 2x12 Open chain toe flexion 2x12 Seated heel raises/toe lifts 2x10 Yellow band PF 2x10 Yellow band inv 2x10 Yellow band ev 2x10 Yellow band DF 2x10 Modalities: Cold pack with elevation x10 mins  PATIENT EDUCATION:  Education details: rationale for interventions, HEP  Person educated: Patient Education method: Explanation, Demonstration, Tactile cues, Verbal cues Education comprehension: verbalized understanding, returned demonstration, verbal cues required, tactile cues required, and needs further education     HOME EXERCISE PROGRAM: Access Code: [email or web address] URL: [email or web address] Date: 01/28/2024 Prepared by: Liborio Reeds  Exercises - Long Sitting Calf Stretch with Strap  - 1-2 x daily - 7 x weekly - 1 sets - 3-5 reps - 30 hold - Long Sitting Ankle Pumps  - 1-2 x daily - 7 x weekly - 1-2 sets - 10 reps - Supine Ankle Circles  - 1-2 x daily - 7 x weekly - 1-2 sets - 10 reps - Towel Scrunches  - 1 x daily - 7 x weekly - 1 sets - 10 reps - Toe Yoga - Alternating Great Toe and Lesser Toe Extension  - 1 x daily - 7 x weekly - 1 sets - 10 reps - Ankle Plantar Flexion with Resistance  - 1 x daily - 7 x weekly - 1-2 sets - 10 reps - 2 hold - Long Sitting Ankle Eversion with Resistance (Mirrored)  - 1 x daily - 7 x weekly - 1-2 sets - 10 reps - 2 hold - Long Sitting Ankle Inversion with Resistance  - 1 x daily - 7 x weekly - 1-2 sets - 10 reps - 2 hold - Gastroc Stretch on Wall  - 1 x daily - 7 x weekly - 1 sets - 2-3 reps - 30 hold - Soleus Stretch on Wall   - 1 x daily - 7 x weekly - 1 sets - 2-3 reps - 30 hold - Standing Tandem Balance with Counter Support  - 1 x daily - 7 x weekly - 1 sets - 4 reps - 30 hold  ASSESSMENT:  CLINICAL IMPRESSION: 01/28/24: Treatment session was decreased with pt arriving late. PT was completed for R ankle ROM, strengthening and balance. Pt tolerated the initiation wt bearing ROM stretches and balance activities. AROM for DF has improved to neutral. Pt is gradually weaning herself from the walking boot as tolerated. A cold pack was provided at the end of session for symptom management. Pt is making appropriate progress. Pt will continue to benefit from skilled PT to address impairments for improved R ankle function.  Per eval - Patient is a 23 y.o. female who was seen today for physical therapy evaluation and treatment for s/p right ankle fracture.  11/12/23: OPEN TREATMENT RIGHT PILON ANKLE FRACTURE WITH FIBULA. Pt presents 8 weeks s/p Sx. The r ankle is swollen and moderately painful with decreased ROM. A HEP was developed. Pt will benefit from skilled PT 2w8 to address impairments to optimize R ankle function with less pain.   OBJECTIVE IMPAIRMENTS: decreased activity tolerance, difficulty walking, decreased ROM, decreased strength, and pain.   ACTIVITY LIMITATIONS: bending, standing, squatting, and locomotion level  PARTICIPATION LIMITATIONS: meal prep, cleaning, shopping, community activity, and occupation  PERSONAL FACTORS: Time since onset of injury/illness/exacerbation are also affecting patient's functional outcome.   REHAB POTENTIAL: Good  CLINICAL DECISION MAKING: Evolving/moderate complexity  EVALUATION COMPLEXITY: Moderate   GOALS:   SHORT TERM GOALS: Target date: 01/29/24 Pt will be Ind in an initial HEP  Baseline:started Goal status: MET  2.  Increase R ankle AROM for DF to neutral PF by 10d  Baseline: see flow sheets Goal status: MET  LONG TERM GOALS: Target date: 03/18/24  Pt will be  Ind in a final HEP to maintain achieved LOF  Baseline:  Goal status: INITIAL  2.  Increase R ankle AROM to with 85% of the L for proper function of the R ankle with ambulation Baseline: see flow sheets Goal status: INITIAL  3.  Pt will demonstrate R single leg balance within 85% of the L for proper R ankle /LE function Baseline:  Goal status: INITIAL  4. Improve 5xSTS by MCID of 5" and by MCID of 42ft as indication of improved functional mobility  Baseline: To assess when pt is walking with gait pattern exhibiting min gait deficits Goal status: INITIAL  5.  Pt  will demonstrate a normalized gait pattern for a distance of 1000' for community mobility Baseline:  Goal status: INITIAL  6.  Pt will be able to ascend/descend steps in a reciprocating pattern c 1 HR assist or less for improved function of the R ankle Baseline: Step to pattern Goal status: INITIAL  7. Pt's LEFS score will improved by at least the MCID to 46% as indication of improved function Baseline:: 33%  Goal Status: INITIAL   PLAN:  PT FREQUENCY: 2x/week  PT DURATION: 8 weeks  PLANNED INTERVENTIONS: 97164- PT Re-evaluation, 97110-Therapeutic exercises, 97530- Therapeutic activity, 97112- Neuromuscular re-education, 97535- Self Care, 78295- Manual therapy, [phone]- Gait training, [phone]- Electrical stimulation (unattended), Patient/Family education, Balance training, Stair training, Taping, Dry Needling, Joint mobilization, Scar mobilization, Cryotherapy, and Moist heat  PLAN FOR NEXT SESSION: Assess response to HEP; progress therex as indicated; use of modalities, manual therapy; and TPDN as indicated.  Denya Buckingham MS, PT 01/28/24 9:44 AM

## 2024-01-27 NOTE — Progress Notes (Signed)
 Subjective:        Laurie [name] is a 23 y.o. adult here for a routine exam.  Current complaints: None.    Personal health questionnaire:  Is patient Laurie [name], have a family history of breast and/or ovarian cancer: yes Is there a family history of uterine cancer diagnosed at age < 15, gastrointestinal cancer, urinary tract cancer, family member who is a Personnel officer syndrome-associated carrier: yes Is the patient overweight and hypertensive, family history of diabetes, personal history of gestational diabetes, preeclampsia or PCOS: no Is patient over 36, have PCOS,  family history of premature CHD under age 6, diabetes, smoke, have hypertension or peripheral artery disease:  no At any time, has a partner hit, kicked or otherwise hurt or frightened you?: no Over the past 2 weeks, have you felt down, depressed or hopeless?: no Over the past 2 weeks, have you felt little interest or pleasure in doing things?:no   Gynecologic History Patient's last menstrual period was 01/12/2024 (exact date). Contraception: OCP (estrogen/progesterone) Last Pap: n/a. Results were: n/a Last mammogram: n/a. Results were: n/a  Obstetric History OB History  No obstetric history on file.    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/28/2018   Prediabetes 02/08/2016   Vitamin D deficiency 12/21/2014    Past Surgical History:  Procedure Laterality Date   COLONOSCOPY WITH ESOPHAGOGASTRODUODENOSCOPY (EGD)  10/27/2022   ORIF ANKLE FRACTURE Right 11/12/2023   Procedure: OPEN TREATMENT RIGHT PILON ANKLE FRACTURE WITH FIBULA;  Surgeon: Terance Hart, MD;  Location: [facility] OR;  Service: Orthopedics;  Laterality: Right;     Current Outpatient Medications:    loratadine (CLARITIN) 10 MG tablet, Take 1 tablet (10 mg total) by mouth daily., Disp: 30 tablet, Rfl: 11   acetaminophen (TYLENOL) 500 MG tablet, Take 500 mg by mouth 2 (two) times daily as needed for moderate pain  (pain score 4-6) or headache. (Patient not taking: Reported on 01/27/2024), Disp: , Rfl:    aspirin (BAYER ASPIRIN) 325 MG tablet, Take 1 tablet (325 mg total) by mouth daily. (Patient not taking: Reported on 01/27/2024), Disp: 100 tablet, Rfl: 3   Norgestrel (OPILL) 0.075 MG TABS, Take 0.075 mg by mouth daily at 12 noon. (Patient not taking: Reported on 01/27/2024), Disp: , Rfl:    valACYclovir (VALTREX) 500 MG tablet, Take 1 tablet (500 mg total) by mouth 2 (two) times daily. (Patient not taking: Reported on 01/27/2024), Disp: 30 tablet, Rfl: 0 No Known Allergies  Social History   Tobacco Use   Smoking status: Some Days    Types: Cigarettes   Smokeless tobacco: Never  Substance Use Topics   Alcohol use: Yes    Comment: socially    Family History  Problem Relation Age of Onset   Colon cancer Maternal Grandmother    Cancer Maternal Grandmother    Alcohol abuse Maternal Grandmother    Diabetes Maternal Grandfather    Heart disease Maternal Grandfather    Hyperlipidemia Maternal Grandfather    Hypertension Maternal Grandfather    Alcohol abuse Maternal Grandfather    Obesity Paternal Grandmother    Hyperlipidemia Paternal Grandmother    Hypertension Paternal Grandmother    Obesity Paternal Grandfather    Heart disease Paternal Grandfather        A fib related to history of rheumatic heart disease   Diabetes Paternal Grandfather    Asthma Neg Hx    Esophageal cancer Neg Hx    Stomach cancer Neg Hx  Rectal cancer Neg Hx       Review of Systems  Constitutional: negative for fatigue and weight loss Respiratory: negative for cough and wheezing Cardiovascular: negative for chest pain, fatigue and palpitations Gastrointestinal: negative for abdominal pain and change in bowel habits Musculoskeletal:negative for myalgias Neurological: negative for gait problems and tremors Behavioral/Psych: negative for abusive relationship, depression Endocrine: negative for temperature  intolerance    Genitourinary:negative for abnormal menstrual periods, genital lesions, hot flashes, sexual problems and vaginal discharge Integument/breast: negative for breast lump, breast tenderness, nipple discharge and skin lesion(s)    Objective:       BP 107/69   Pulse 73   Wt 163 lb 6.4 oz (74.1 kg)   LMP 01/12/2024 (Exact Date)   BMI 30.87 kg/m  General:   alert  Skin:   no rash or abnormalities  Lungs:   clear to auscultation bilaterally  Heart:   regular rate and rhythm, S1, S2 normal, no murmur, click, rub or gallop  Breasts:   normal without suspicious masses, skin or nipple changes or axillary nodes  Abdomen:  normal findings: no organomegaly, soft, non-tender and no hernia  Pelvis:  External genitalia: normal general appearance Urinary system: urethral meatus normal and bladder without fullness, nontender Vaginal: normal without tenderness, induration or masses Cervix: normal appearance Adnexa: normal bimanual exam Uterus: anteverted and non-tender, normal size   Lab Review Urine pregnancy test Labs reviewed yes Radiologic studies reviewed no  I have spent a total of 20 minutes of face-to-face time, excluding clinical staff time, reviewing notes and preparing to see patient, ordering tests and/or medications, and counseling the patient.   Assessment:    1. Encounter for gynecological examination with Papanicolaou smear of cervix (Primary) Rx: - Cytology - PAP( [facility])  2. 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 Screening for diabetes mellitus Rx: - Hemoglobin A1c  4. Seasonal allergic rhinitis due to pollen Rx: - loratadine (CLARITIN) 10 MG tablet; Take 1 tablet (10 mg total) by mouth daily.  Dispense: 30 tablet; Refill: 11     Plan:    Education reviewed: calcium supplements, depression evaluation, low fat, low cholesterol diet, safe sex/STD prevention, self breast exams, skin cancer screening, and weight bearing  exercise. Contraception: Nexplanon. Follow up in: 2 weeks.   Meds ordered this encounter  Medications   loratadine (CLARITIN) 10 MG tablet    Sig: Take 1 tablet (10 mg total) by mouth daily.    Dispense:  30 tablet    Refill:  11   Orders Placed This Encounter  Procedures   Hemoglobin A1c   Empower BRCA (2)    Personal and family History of Cancer If there are multiple personal or family cancer history, please enter details below -  Personal History of Cancer:  Cancer Type: Breast Age of Diagnosis:  Cancer Type: Breast Age of Diagnosis:   Family History of Cancer:  Cancer type: Breast Relation: Aunt Paternal/Maternal: maternal Age of Diagnosis:    Cancer type: Breast Relation: Grandmother Paternal/Maternal: maternal Age of Diagnosis: 35   ==========Department Information========== ID: [id number] Department:[facility] [address], [address] [place] [address] Dept: [phone] Dept Fax: [phone]    Ethnicity of patient::   Caucasian    Patient and physician allow Linard Reno to share order details with 3rd party genetic counselor?:   Yes  Does this patient have a personal history of cancer?:   No    Does this patient have a known family history of cancer?:   Yes-complete paperwork and place in kit    By placing this electronic order I confirm the testing ordered herein is medically necessary and this patient has been informed of the details of the genetic test(s) ordered, including the risks, benefits, and alternatives, and has consented to testing.:   Yes    Select an order diagnosis: For additional options refer to [email or web address]:   Family history of malignant neoplasm of breast [V16.3.ICD-9-CM]    What type of billing?:   Bill Insurance    Gabrielle Joiner, MD, FACOG Attending Obstetrician & Gynecologist, [facility]  for [facility], [facility], [place] 01/27/2024

## 2024-01-28 ENCOUNTER — Ambulatory Visit

## 2024-01-28 DIAGNOSIS — M6281 Muscle weakness (generalized): Secondary | ICD-10-CM

## 2024-01-28 DIAGNOSIS — M25571 Pain in right ankle and joints of right foot: Secondary | ICD-10-CM | POA: Diagnosis not present

## 2024-01-28 DIAGNOSIS — R262 Difficulty in walking, not elsewhere classified: Secondary | ICD-10-CM

## 2024-01-28 LAB — HEMOGLOBIN A1C
Est. average glucose Bld gHb Est-mCnc: 91 mg/dL
Hgb A1c MFr Bld: 4.8 % (ref 4.8–5.6)

## 2024-02-01 LAB — CYTOLOGY - PAP

## 2024-02-02 ENCOUNTER — Ambulatory Visit: Admitting: Physical Therapy

## 2024-02-02 ENCOUNTER — Telehealth: Payer: Self-pay | Admitting: Physical Therapy

## 2024-02-02 NOTE — Telephone Encounter (Signed)
 Left voicemail for patient regarding no show to appointment. Left next appointment date and time.

## 2024-02-03 NOTE — Therapy (Signed)
 OUTPATIENT PHYSICAL THERAPY TREATMENT   Patient Name: Laurie [name] MRN: [id number] DOB:[birth year], 23 y.o., adult Today's Date: 02/04/2024  END OF SESSION:  PT End of Session - 02/04/24 0926     Visit Number 6    Number of Visits 16    Date for PT Re-Evaluation 03/18/24    Authorization Type [place] MEDICAID [employer]    Authorization - Visit Number [id number]    Authorization - Number of Visits 27    PT Start Time 0845    PT Stop Time 0935    PT Time Calculation (min) 50 min    Activity Tolerance Patient tolerated treatment well    Behavior During Therapy WFL for tasks assessed/performed                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/28/2018   Prediabetes 02/08/2016   Vitamin D  deficiency 12/21/2014   Past Surgical History:  Procedure Laterality Date   COLONOSCOPY WITH ESOPHAGOGASTRODUODENOSCOPY (EGD)  10/27/2022   ORIF ANKLE FRACTURE Right 11/12/2023   Procedure: OPEN TREATMENT RIGHT PILON ANKLE FRACTURE WITH FIBULA;  Surgeon: Donnamarie Gables, MD;  Location: [facility] OR;  Service: Orthopedics;  Laterality: Right;   Patient Active Problem List   Diagnosis Date Noted   Endocrine disorder, unspecified 11/21/2019   Menstrual cramps 02/08/2016   Frequent headaches 12/21/2014   BMI (body mass index), pediatric, greater than or equal to 95% for age 34/07/2015    PCP: Abraham Abo, MD  REFERRING PROVIDER: Donnamarie Gables, MD  REFERRING DIAG: s/p right ankle fracture   THERAPY DIAG:  Pain in right ankle and joints of right foot  Muscle weakness (generalized)  Difficulty in walking, not elsewhere classified  Rationale for Evaluation and Treatment: Rehabilitation  ONSET DATE: 11/02/23 Fx ankle rock climbing with fall from 15 ft. Sx 11/12/23 8 weeks s/p  SUBJECTIVE:   SUBJECTIVE STATEMENT:  Pt reports having an increase in R ankle pain after being up on it more last weekend, but the pain has  resolved.  PERTINENT HISTORY: See PMH  PAIN:  Are you having pain? Yes: NPRS scale: 0/10 Pain location: R ankle Pain description: ache Aggravating factors: prolonged time on feet Relieving factors: Cold pack  PRECAUTIONS: Other: WB in walking boot  RED FLAGS: None   WEIGHT BEARING RESTRICTIONS:  WB in walking boot  Call Dr. Verneita Goldmann office for status of Wt bearing exs in standing out of the walking boot while at PT  Per note from evaluating PT: "4/4 call MD who states no CKC WB out of boot yet. Only open chain out of boot at this time" Per pt report after MD visit 01/26/24 cleared to WBAT outside boot  FALLS:  Has patient fallen in last 6 months? Yes. 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 Hulda Mage  OBJECTIVE:  Note: Objective measures were completed at Evaluation unless otherwise noted.  DIAGNOSTIC FINDINGS:  DG R Ankle 11/12/23 IMPRESSION: Intraoperative fluoroscopic guidance for ORIF of the distal tibia and fibula.  PATIENT SURVEYS:  LEFS 26/80=33% ability, 67% disability  COGNITION: Overall cognitive status: Within functional limits for tasks assessed     SENSATION: WFL  EDEMA:  Significant swelling present  MUSCLE LENGTH: Hamstrings: Right WNLs deg; Left WNLs deg Andy Bannister test: Right NT deg; Left NT deg  POSTURE: No Significant postural limitations  PALPATION: TTP of the R peri-anlke  LOWER EXTREMITY ROM:  Bilat hips and knees WNLs Active ROM Right eval Left eval RT 01/20/24 RT 01/28/24 Rt 02/04/24  Hip flexion       Hip extension       Hip abduction       Hip adduction       Hip internal rotation       Hip external rotation       Knee flexion        Knee extension       Ankle dorsiflexion A 10 lacking, P 0 A 13 A 3 lacking 0 A 4 A  Ankle plantarflexion A 20 A 40 32 A 32 A   Ankle inversion A 9 A 32 A 18    Ankle eversion A 12 A 21 A 14     (Blank rows = not tested)  LOWER EXTREMITY MMT:  Bilat hip and kness 5/5. R ankle NT MMT Right eval Left eval  Hip flexion    Hip extension    Hip abduction    Hip adduction    Hip internal rotation    Hip external rotation    Knee flexion    Knee extension    Ankle dorsiflexion    Ankle plantarflexion    Ankle inversion    Ankle eversion     (Blank rows = not tested)  LOWER EXTREMITY SPECIAL TESTS:  NT  FUNCTIONAL TESTS: TBA when able to walk with min gait deficit  5 times sit to stand: TBA 2 minute walk test: TBA  GAIT: Distance walked: 200" Assistive device utilized:  walking boot Level of assistance: Modified independence Comments: WNLs c walking boot                                                                                                          TREATMENT DATE:  [facility] Adult PT Treatment:                                                DATE: 02/04/24 Therapeutic Exercise:l Ankle DF/PF x15 Ankle circles CW/CCW x15 Standing gastroc and soleus stretches 2x each 1 min Red band PF 2x10 Red band inv 2x10 Red band ev 2x10 Red band DF 2x10 Therapeutic Activity: Rec bike 5 mins L3 Standing Ankle DF/PF 2x10  Tandem standing SL standing Modalities: 10 min cold pack R ankle, LE elevated  [facility] Adult PT Treatment:                                                DATE: 01/28/24 Therapeutic Exercise: Towel scrunches Toe yoga  Ankle inv/ev on towel Ankle DF/PF seated c 10# Ankle circles CW/CCW x12  Standing gastroc and soleus stretches 2x ech 30 sec Therapeutic Activity: Ankle DF/PF seated c 10# Tandem standing Modalities: 10 min cold pack R ankle, LE elevated  [facility] Adult PT Treatment:                                                DATE: 01/26/24 Therapeutic  Exercise: Ankle circles CW/CCW x12  Ankle pump full ROM x12 Yellow band PF x15 Yellow band DF x15  Neuromuscular re-ed: Toe flexion 2x10 cues for tripod foot  Isolated hallux ext 2x10 cues for tripod Isolated lateral toe ext 2x10  Therapeutic Activity: AP weight shift RW 2x8 cues for comfortable mechanics ML weight shift RW 2x8  Education/discussion re: gradual progression of activities per physician recommendation, monitoring symptoms and appropriate activity modification, rationale for interventions and relevant anatomy/physiology   Modalities: 10 min cold pack R ankle, LE elevated  PATIENT EDUCATION:  Education details: rationale for interventions, HEP  Person educated: Patient Education method: Explanation, Demonstration, Tactile cues, Verbal cues Education comprehension: verbalized understanding, returned demonstration, verbal cues required, tactile cues required, and needs further education     HOME EXERCISE PROGRAM: Access Code: [email or web address] URL: [email or web address] Date: 01/28/2024 Prepared by: Liborio Reeds  Exercises - Long Sitting Calf Stretch with Strap  - 1-2 x daily - 7 x weekly - 1 sets - 3-5 reps - 30 hold - Long Sitting Ankle Pumps  - 1-2 x daily - 7 x weekly - 1-2 sets - 10 reps - Supine Ankle Circles  - 1-2 x daily - 7 x weekly - 1-2 sets - 10 reps - Towel Scrunches  - 1 x daily - 7 x weekly - 1 sets - 10 reps - Toe Yoga - Alternating Great Toe and Lesser Toe Extension  - 1 x daily - 7 x weekly - 1 sets - 10 reps - Ankle Plantar Flexion with Resistance  - 1 x daily - 7 x weekly - 1-2 sets - 10 reps - 2 hold - Long Sitting Ankle Eversion with Resistance (Mirrored)  - 1 x daily - 7 x weekly - 1-2 sets - 10 reps - 2 hold - Long Sitting Ankle Inversion with Resistance  - 1 x daily - 7 x weekly - 1-2 sets - 10 reps - 2 hold - Gastroc Stretch on Wall  - 1 x daily - 7 x weekly - 1 sets - 2-3 reps - 30 hold - Soleus Stretch on Wall  - 1 x daily - 7 x  weekly - 1 sets - 2-3 reps - 30 hold - Standing Tandem Balance with Counter Support  - 1 x daily - 7 x weekly - 1 sets - 4 reps - 30 hold  ASSESSMENT:  CLINICAL IMPRESSION: 02/04/24: PT was completed for R ankle ROM, strength, and balance. Heel raises and Sl balance was started. Mon hand assit was needed for  SL balance. AROM for DF has improved to 4d. Pt walks with a min limp over her R LE. Progress is appropriate. Pt tolerated PT today without adverse effects. Pt will continue to benefit from skilled PT to address impairments for improved function.   Treatment session was decreased with pt arriving late. PT was completed for R ankle ROM, strengthening and balance. Pt tolerated the initiation wt  bearing ROM stretches and balance activities. AROM for DF has improved to neutral. Pt is gradually weaning herself from the walking boot as tolerated. A cold pack was provided at the end of session for symptom management. Pt is making appropriate progress. Pt will continue to benefit from skilled PT to address impairments for improved R ankle function.   Per eval - Patient is a 23 y.o. female who was seen today for physical therapy evaluation and treatment for s/p right ankle fracture.  11/12/23: OPEN TREATMENT RIGHT PILON ANKLE FRACTURE WITH FIBULA. Pt presents 8 weeks s/p Sx. The r ankle is swollen and moderately painful with decreased ROM. A HEP was developed. Pt will benefit from skilled PT 2w8 to address impairments to optimize R ankle function with less pain.   OBJECTIVE IMPAIRMENTS: decreased activity tolerance, difficulty walking, decreased ROM, decreased strength, and pain.   ACTIVITY LIMITATIONS: bending, standing, squatting, and locomotion level  PARTICIPATION LIMITATIONS: meal prep, cleaning, shopping, community activity, and occupation  PERSONAL FACTORS: Time since onset of injury/illness/exacerbation are also affecting patient's functional outcome.   REHAB POTENTIAL: Good  CLINICAL  DECISION MAKING: Evolving/moderate complexity  EVALUATION COMPLEXITY: Moderate   GOALS:   SHORT TERM GOALS: Target date: 01/29/24 Pt will be Ind in an initial HEP  Baseline:started Goal status: MET  2.  Increase R ankle AROM for DF to neutral PF by 10d  Baseline: see flow sheets Goal status: MET  LONG TERM GOALS: Target date: 03/18/24  Pt will be Ind in a final HEP to maintain achieved LOF  Baseline:  Goal status: INITIAL  2.  Increase R ankle AROM to with 85% of the L for proper function of the R ankle with ambulation Baseline: see flow sheets Goal status: INITIAL  3.  Pt will demonstrate R single leg balance within 85% of the L for proper R ankle /LE function Baseline:  Goal status: INITIAL  4. Improve 5xSTS by MCID of 5" and by MCID of 26ft as indication of improved functional mobility  Baseline: To assess when pt is walking with gait pattern exhibiting min gait deficits Goal status: INITIAL  5.  Pt  will demonstrate a normalized gait pattern for a distance of 1000' for community mobility Baseline:  Goal status: INITIAL  6.  Pt will be able to ascend/descend steps in a reciprocating pattern c 1 HR assist or less for improved function of the R ankle Baseline: Step to pattern Goal status: INITIAL  7. Pt's LEFS score will improved by at least the MCID to 46% as indication of improved function Baseline:: 33%  Goal Status: INITIAL   PLAN:  PT FREQUENCY: 2x/week  PT DURATION: 8 weeks  PLANNED INTERVENTIONS: 97164- PT Re-evaluation, 97110-Therapeutic exercises, 97530- Therapeutic activity, 97112- Neuromuscular re-education, 97535- Self Care, 16109- Manual therapy, [phone]- Gait training, [phone]- Electrical stimulation (unattended), Patient/Family education, Balance training, Stair training, Taping, Dry Needling, Joint mobilization, Scar mobilization, Cryotherapy, and Moist heat  PLAN FOR NEXT SESSION: Assess response to HEP; progress therex as indicated; use of  modalities, manual therapy; and TPDN as indicated.  Adriel Desrosier MS, PT 02/04/24 9:42 AM

## 2024-02-04 ENCOUNTER — Ambulatory Visit

## 2024-02-04 DIAGNOSIS — M6281 Muscle weakness (generalized): Secondary | ICD-10-CM

## 2024-02-04 DIAGNOSIS — M25571 Pain in right ankle and joints of right foot: Secondary | ICD-10-CM

## 2024-02-04 DIAGNOSIS — R262 Difficulty in walking, not elsewhere classified: Secondary | ICD-10-CM

## 2024-02-08 NOTE — Therapy (Signed)
 OUTPATIENT PHYSICAL THERAPY TREATMENT   Patient Name: Laurie [name] MRN: [id number] DOB:05/23/2001, 23 y.o., adult Today's Date: 02/09/2024  END OF SESSION:  PT End of Session - 02/09/24 0907     Visit Number 7    Number of Visits 16    Date for PT Re-Evaluation 03/18/24    Authorization Type [place] MEDICAID [employer]    Authorization - Visit Number 7    Authorization - Number of Visits 27    PT Start Time 0850    PT Stop Time 0930    PT Time Calculation (min) 40 min    Activity Tolerance Patient tolerated treatment well    Behavior During Therapy [facility] for tasks assessed/performed                 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/28/2018   Prediabetes 02/08/2016   Vitamin D  deficiency 12/21/2014   Past Surgical History:  Procedure Laterality Date   COLONOSCOPY WITH ESOPHAGOGASTRODUODENOSCOPY (EGD)  10/27/2022   ORIF ANKLE FRACTURE Right 11/12/2023   Procedure: OPEN TREATMENT RIGHT PILON ANKLE FRACTURE WITH FIBULA;  Surgeon: Donnamarie Gables, MD;  Location: [facility] OR;  Service: Orthopedics;  Laterality: Right;   Patient Active Problem List   Diagnosis Date Noted   Endocrine disorder, unspecified 11/21/2019   Menstrual cramps 02/08/2016   Frequent headaches 12/21/2014   BMI (body mass index), pediatric, greater than or equal to 95% for age 31/07/2015    PCP: Abraham Abo, MD  REFERRING PROVIDER: Donnamarie Gables, MD  REFERRING DIAG: s/p right ankle fracture   THERAPY DIAG:  Pain in right ankle and joints of right foot  Muscle weakness (generalized)  Difficulty in walking, not elsewhere classified  Rationale for Evaluation and Treatment: Rehabilitation  ONSET DATE: 11/02/23 Fx ankle rock climbing with fall from 15 ft. Sx 11/12/23 8 weeks s/p  SUBJECTIVE:   SUBJECTIVE STATEMENT:  Pt reports function and pain are both improving. Increased walking increases pain.  PERTINENT HISTORY: See  PMH  PAIN:  Are you having pain? Yes: NPRS scale: 2/10 Pain location: R ankle Pain description: ache Aggravating factors: prolonged time on feet Relieving factors: Cold pack  PRECAUTIONS: Other: WB in walking boot  RED FLAGS: None   WEIGHT BEARING RESTRICTIONS:  WB in walking boot  Call Dr. Verneita Goldmann office for status of Wt bearing exs in standing out of the walking boot while at PT  Per note from evaluating PT: "4/4 call MD who states no CKC WB out of boot yet. Only open chain out of boot at this time" Per pt report after MD visit 01/26/24 cleared to WBAT outside boot  FALLS:  Has patient fallen in last 6 months? Yes. 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 Hulda Mage  OBJECTIVE:  Note: Objective measures were completed at Evaluation unless otherwise noted.  DIAGNOSTIC FINDINGS:  DG R Ankle 11/12/23 IMPRESSION: Intraoperative fluoroscopic guidance for ORIF of the distal tibia and fibula.  PATIENT SURVEYS:  LEFS 26/80=33% ability, 67% disability  COGNITION: Overall cognitive status: Within functional limits for tasks assessed     SENSATION: WFL  EDEMA:  Significant swelling present  MUSCLE LENGTH: Hamstrings: Right  WNLs deg; Left WNLs deg Andy Bannister test: Right NT deg; Left NT deg  POSTURE: No Significant postural limitations  PALPATION: TTP of the R peri-anlke  LOWER EXTREMITY ROM:  Bilat hips and knees WNLs Active ROM Right eval Left eval RT 01/20/24 RT 01/28/24 Rt 02/04/24  Hip flexion       Hip extension       Hip abduction       Hip adduction       Hip internal rotation       Hip external rotation       Knee flexion       Knee extension       Ankle  dorsiflexion A 10 lacking, P 0 A 13 A 3 lacking 0 A 4 A  Ankle plantarflexion A 20 A 40 32 A 32 A   Ankle inversion A 9 A 32 A 18    Ankle eversion A 12 A 21 A 14     (Blank rows = not tested)  LOWER EXTREMITY MMT:  Bilat hip and kness 5/5. R ankle NT MMT Right eval Left eval  Hip flexion    Hip extension    Hip abduction    Hip adduction    Hip internal rotation    Hip external rotation    Knee flexion    Knee extension    Ankle dorsiflexion    Ankle plantarflexion    Ankle inversion    Ankle eversion     (Blank rows = not tested)  LOWER EXTREMITY SPECIAL TESTS:  NT  FUNCTIONAL TESTS: TBA when able to walk with min gait deficit  5 times sit to stand: TBA 2 minute walk test: TBA  GAIT: Distance walked: 200" Assistive device utilized:  walking boot Level of assistance: Modified independence Comments: WNLs c walking boot                                                                                                          TREATMENT DATE:  [facility] Adult PT Treatment:                                                DATE: 02/08/24 Therapeutic Exercise:l Ankle DF/PF x15 Ankle circles CW/CCW x15 Standing gastroc and soleus stretches 2x each 1 min Therapeutic Activity: Rec bike 5 mins L3 5xSTS 13.3" 2 MWT 494' Lunges to bosu ball 2x15 Standing Ankle DF/PF x20  SL standing 30"+ SL balance on theraband green mat Wall slide squats 2x10  [facility] Adult PT Treatment:                                                DATE: 02/04/24 Therapeutic Exercise:l Ankle DF/PF x15 Ankle circles CW/CCW x15 Standing gastroc and soleus stretches 2x each 1 min Red band  PF 2x10 Red band inv 2x10 Red band ev 2x10 Red band DF 2x10 Therapeutic Activity: Rec bike 5 mins L3 Standing Ankle DF/PF 2x10  Tandem standing SL standing Modalities: 10 min cold pack R ankle, LE elevated  [facility] Adult PT Treatment:                                                DATE: 01/28/24 Therapeutic  Exercise: Towel scrunches Toe yoga Ankle inv/ev on towel Ankle DF/PF seated c 10# Ankle circles CW/CCW x12  Standing gastroc and soleus stretches 2x ech 30 sec Therapeutic Activity: Ankle DF/PF seated c 10# Tandem standing Modalities: 10 min cold pack R ankle, LE elevated  PATIENT EDUCATION:  Education details: rationale for interventions, HEP  Person educated: Patient Education method: Explanation, Demonstration, Tactile cues, Verbal cues Education comprehension: verbalized understanding, returned demonstration, verbal cues required, tactile cues required, and needs further education     HOME EXERCISE PROGRAM: Access Code: [email or web address] URL: [email or web address] Date: 01/28/2024 Prepared by: Liborio Reeds  Exercises - Long Sitting Calf Stretch with Strap  - 1-2 x daily - 7 x weekly - 1 sets - 3-5 reps - 30 hold - Long Sitting Ankle Pumps  - 1-2 x daily - 7 x weekly - 1-2 sets - 10 reps - Supine Ankle Circles  - 1-2 x daily - 7 x weekly - 1-2 sets - 10 reps - Towel Scrunches  - 1 x daily - 7 x weekly - 1 sets - 10 reps - Toe Yoga - Alternating Great Toe and Lesser Toe Extension  - 1 x daily - 7 x weekly - 1 sets - 10 reps - Ankle Plantar Flexion with Resistance  - 1 x daily - 7 x weekly - 1-2 sets - 10 reps - 2 hold - Long Sitting Ankle Eversion with Resistance (Mirrored)  - 1 x daily - 7 x weekly - 1-2 sets - 10 reps - 2 hold - Long Sitting Ankle Inversion with Resistance  - 1 x daily - 7 x weekly - 1-2 sets - 10 reps - 2 hold - Gastroc Stretch on Wall  - 1 x daily - 7 x weekly - 1 sets - 2-3 reps - 30 hold - Soleus Stretch on Wall  - 1 x daily - 7 x weekly - 1 sets - 2-3 reps - 30 hold - Standing Tandem Balance with Counter Support  - 1 x daily - 7 x weekly - 1 sets - 4 reps - 30 hold  ASSESSMENT:  CLINICAL IMPRESSION: 02/09/24: PT was completed for functional activities with assessment of 5xST, , and SL balance. SL balance was found WNLs, while 5xSTS and  show deficits. PT was completed for ROM, strength, and balance to improve pt's R ankle function. Pt tolerated the prescribed exercises without adverse effects. Pt notes experiencing better function with less pain. Will continue progressively increase the demand of pt's therapy as tolerated.  Per eval - Patient is a 23 y.o. female who was seen today for physical therapy evaluation and treatment for s/p right ankle fracture.  11/12/23: OPEN TREATMENT RIGHT PILON ANKLE FRACTURE WITH FIBULA. Pt presents 8 weeks s/p Sx. The r ankle is swollen and moderately painful with decreased ROM. A HEP was developed. Pt will benefit from skilled PT 2w8 to address impairments to optimize R ankle function with less pain.  OBJECTIVE IMPAIRMENTS: decreased activity tolerance, difficulty walking, decreased ROM, decreased strength, and pain.   ACTIVITY LIMITATIONS: bending, standing, squatting, and locomotion level  PARTICIPATION LIMITATIONS: meal prep, cleaning, shopping, community activity, and occupation  PERSONAL FACTORS: Time since onset of injury/illness/exacerbation are also affecting patient's functional outcome.   REHAB POTENTIAL: Good  CLINICAL DECISION MAKING: Evolving/moderate complexity  EVALUATION COMPLEXITY: Moderate   GOALS:   SHORT TERM GOALS: Target date: 01/29/24 Pt will be Ind in an initial HEP  Baseline:started Goal status: MET  2.  Increase R ankle AROM for DF to neutral PF by 10d  Baseline: see flow sheets Goal status: MET  LONG TERM GOALS: Target date: 03/18/24  Pt will be Ind in a final HEP to maintain achieved LOF  Baseline:  Goal status: INITIAL  2.  Increase R ankle AROM to with 85% of the L for proper function of the R ankle with ambulation Baseline: see flow sheets Goal status: INITIAL  3.  Pt will demonstrate R single leg balance within 85% of the L for proper R ankle /LE function Baseline:  Goal status: INITIAL  4. Improve 5xSTS by MCID of 5" and by MCID of  3ft as indication of improved functional mobility  Baseline: To assess when pt is walking with gait pattern exhibiting min gait deficits Goal status: INITIAL  5.  Pt  will demonstrate a normalized gait pattern for a distance of 1000' for community mobility Baseline:  Goal status: INITIAL  6.  Pt will be able to ascend/descend steps in a reciprocating pattern c 1 HR assist or less for improved function of the R ankle Baseline: Step to pattern Goal status: INITIAL  7. Pt's LEFS score will improved by at least the MCID to 46% as indication of improved function Baseline:: 33%  Goal Status: INITIAL   PLAN:  PT FREQUENCY: 2x/week  PT DURATION: 8 weeks  PLANNED INTERVENTIONS: 97164- PT Re-evaluation, 97110-Therapeutic exercises, 97530- Therapeutic activity, 97112- Neuromuscular re-education, 97535- Self Care, 82956- Manual therapy, [phone]- Gait training, [phone]- Electrical stimulation (unattended), Patient/Family education, Balance training, Stair training, Taping, Dry Needling, Joint mobilization, Scar mobilization, Cryotherapy, and Moist heat  PLAN FOR NEXT SESSION: Assess response to HEP; progress therex as indicated; use of modalities, manual therapy; and TPDN as indicated.  Leylani Duley MS, PT 02/09/24 11:48 AM

## 2024-02-09 ENCOUNTER — Ambulatory Visit

## 2024-02-09 DIAGNOSIS — M6281 Muscle weakness (generalized): Secondary | ICD-10-CM

## 2024-02-09 DIAGNOSIS — M25571 Pain in right ankle and joints of right foot: Secondary | ICD-10-CM

## 2024-02-09 DIAGNOSIS — R262 Difficulty in walking, not elsewhere classified: Secondary | ICD-10-CM

## 2024-02-09 LAB — EMPOWER BRCA (2): REPORT SUMMARY: NEGATIVE

## 2024-02-10 ENCOUNTER — Encounter: Payer: Self-pay | Admitting: Family Medicine

## 2024-02-10 NOTE — Therapy (Signed)
 OUTPATIENT PHYSICAL THERAPY TREATMENT   Patient Name: Laurie [name] MRN: [id number] DOB:[birth year], 23 y.o., adult Today's Date: 02/11/2024  END OF SESSION:  PT End of Session - 02/11/24 0753     Visit Number 8    Number of Visits 16    Date for PT Re-Evaluation 03/18/24    Authorization Type [place] MEDICAID [employer]    Authorization - Visit Number 8    Authorization - Number of Visits 27    PT Start Time [phone]   late check in   PT Stop Time 0836    PT Time Calculation (min) 42 min    Activity Tolerance Patient tolerated treatment well              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/28/2018   Prediabetes 02/08/2016   Vitamin D  deficiency 12/21/2014   Past Surgical History:  Procedure Laterality Date   COLONOSCOPY WITH ESOPHAGOGASTRODUODENOSCOPY (EGD)  10/27/2022   ORIF ANKLE FRACTURE Right 11/12/2023   Procedure: OPEN TREATMENT RIGHT PILON ANKLE FRACTURE WITH FIBULA;  Surgeon: Donnamarie Gables, MD;  Location: [facility] OR;  Service: Orthopedics;  Laterality: Right;   Patient Active Problem List   Diagnosis Date Noted   Endocrine disorder, unspecified 11/21/2019   Menstrual cramps 02/08/2016   Frequent headaches 12/21/2014   BMI (body mass index), pediatric, greater than or equal to 95% for age 05/23/2015    PCP: Abraham Abo, MD  REFERRING PROVIDER: Donnamarie Gables, MD  REFERRING DIAG: s/p right ankle fracture   THERAPY DIAG:  Pain in right ankle and joints of right foot  Muscle weakness (generalized)  Difficulty in walking, not elsewhere classified  Rationale for Evaluation and Treatment: Rehabilitation  ONSET DATE: 11/02/23 Fx ankle rock climbing with fall from 15 ft. Sx 11/12/23 8 weeks s/p  SUBJECTIVE:   SUBJECTIVE STATEMENT: Feel like they are doing well, not using brace unless walking for a long time (over two miles) or feeling sore. Getting closer to baseline for day to day function but still  has some mobility issues. Tends to feel less stiff after PT sessions. Still icing sometimes. Has not yet returned to usual recreational tasks.    PERTINENT HISTORY: See PMH  PAIN:  Are you having pain? Yes: NPRS scale: 0/10 Pain location: R ankle Pain description: ache Aggravating factors: prolonged time on feet Relieving factors: Cold pack  PRECAUTIONS: Other: WB in walking boot  RED FLAGS: None   WEIGHT BEARING RESTRICTIONS:  WB in walking boot  Call Dr. Verneita Goldmann office for status of Wt bearing exs in standing out of the walking boot while at PT  Per note from evaluating PT: "4/4 call MD who states no CKC WB out of boot yet. Only open chain out of boot at this time" Per pt report after MD visit 01/26/24 cleared to WBAT outside boot  FALLS:  Has patient fallen in last 6 months? Yes. 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 Hulda Mage  OBJECTIVE:  Note: Objective measures were completed at Evaluation unless otherwise noted.  DIAGNOSTIC FINDINGS:  DG R Ankle 11/12/23 IMPRESSION: Intraoperative fluoroscopic guidance for ORIF of the distal tibia and fibula.  PATIENT  SURVEYS:  LEFS 26/80=33% ability, 67% disability  COGNITION: Overall cognitive status: Within functional limits for tasks assessed     SENSATION: WFL  EDEMA:  Significant swelling present  MUSCLE LENGTH: Hamstrings: Right WNLs deg; Left WNLs deg Andy Bannister test: Right NT deg; Left NT deg  POSTURE: No Significant postural limitations  PALPATION: TTP of the R peri-anlke  LOWER EXTREMITY ROM:  Bilat hips and knees WNLs Active ROM Right eval Left eval RT 01/20/24 RT 01/28/24 Rt 02/04/24  Hip flexion        Hip extension       Hip abduction       Hip adduction       Hip internal rotation       Hip external rotation       Knee flexion       Knee extension       Ankle dorsiflexion A 10 lacking, P 0 A 13 A 3 lacking 0 A 4 A  Ankle plantarflexion A 20 A 40 32 A 32 A   Ankle inversion A 9 A 32 A 18    Ankle eversion A 12 A 21 A 14     (Blank rows = not tested)  LOWER EXTREMITY MMT:  Bilat hip and kness 5/5. R ankle NT MMT Right eval Left eval  Hip flexion    Hip extension    Hip abduction    Hip adduction    Hip internal rotation    Hip external rotation    Knee flexion    Knee extension    Ankle dorsiflexion    Ankle plantarflexion    Ankle inversion    Ankle eversion     (Blank rows = not tested)  LOWER EXTREMITY SPECIAL TESTS:  NT  FUNCTIONAL TESTS: TBA when able to walk with min gait deficit  5 times sit to stand: TBA 2 minute walk test: TBA  GAIT: Distance walked: 200" Assistive device utilized:  walking boot Level of assistance: Modified independence Comments: WNLs c walking boot                                                                                                          TREATMENT DATE:  [facility] Adult PT Treatment:                                                DATE: 02/11/24 Therapeutic Exercise: Recumbent bike during subjective  Wall sit x61min Staggered wall sit RLE emphasis 3x30sec  Standing heel/toe raises x20 w/ min UE support  1 min soleus stretch 1 min gastroc stretch  Neuromuscular re-ed: Bosu lunge 3x12 RLE no UE support  Dynadisc step up w/ UE support 2x8 cues for reduced quad avoidance Hurdle taps x20 cues for posture/pacing for SL stability    [facility] Adult PT Treatment:  DATE: 02/08/24 Therapeutic Exercise:l Ankle DF/PF x15 Ankle circles CW/CCW x15 Standing gastroc and soleus stretches 2x each 1 min Therapeutic Activity: Rec bike 5 mins L3 5xSTS 13.3" 2 MWT 494' Lunges to bosu  ball 2x15 Standing Ankle DF/PF x20  SL standing 30"+ SL balance on theraband green mat Wall slide squats 2x10  [facility] Adult PT Treatment:                                                DATE: 02/04/24 Therapeutic Exercise:l Ankle DF/PF x15 Ankle circles CW/CCW x15 Standing gastroc and soleus stretches 2x each 1 min Red band PF 2x10 Red band inv 2x10 Red band ev 2x10 Red band DF 2x10 Therapeutic Activity: Rec bike 5 mins L3 Standing Ankle DF/PF 2x10  Tandem standing SL standing Modalities: 10 min cold pack R ankle, LE elevated  [facility] Adult PT Treatment:                                                DATE: 01/28/24 Therapeutic Exercise: Towel scrunches Toe yoga Ankle inv/ev on towel Ankle DF/PF seated c 10# Ankle circles CW/CCW x12  Standing gastroc and soleus stretches 2x ech 30 sec Therapeutic Activity: Ankle DF/PF seated c 10# Tandem standing Modalities: 10 min cold pack R ankle, LE elevated  PATIENT EDUCATION:  Education details: rationale for interventions, HEP  Person educated: Patient Education method: Explanation, Demonstration, Tactile cues, Verbal cues Education comprehension: verbalized understanding, returned demonstration, verbal cues required, tactile cues required, and needs further education     HOME EXERCISE PROGRAM: Access Code: [email or web address] URL: [email or web address] Date: 01/28/2024 Prepared by: Liborio Reeds  Exercises - Long Sitting Calf Stretch with Strap  - 1-2 x daily - 7 x weekly - 1 sets - 3-5 reps - 30 hold - Long Sitting Ankle Pumps  - 1-2 x daily - 7 x weekly - 1-2 sets - 10 reps - Supine Ankle Circles  - 1-2 x daily - 7 x weekly - 1-2 sets - 10 reps - Towel Scrunches  - 1 x daily - 7 x weekly - 1 sets - 10 reps - Toe Yoga - Alternating Great Toe and Lesser Toe Extension  - 1 x daily - 7 x weekly - 1 sets - 10 reps - Ankle Plantar Flexion with Resistance  - 1 x daily - 7 x weekly - 1-2 sets - 10 reps - 2 hold - Long Sitting Ankle  Eversion with Resistance (Mirrored)  - 1 x daily - 7 x weekly - 1-2 sets - 10 reps - 2 hold - Long Sitting Ankle Inversion with Resistance  - 1 x daily - 7 x weekly - 1-2 sets - 10 reps - 2 hold - Gastroc Stretch on Wall  - 1 x daily - 7 x weekly - 1 sets - 2-3 reps - 30 hold - Soleus Stretch on Wall  - 1 x daily - 7 x weekly - 1 sets - 2-3 reps - 30 hold - Standing Tandem Balance with Counter Support  - 1 x daily - 7 x weekly - 1 sets - 4 reps - 30 hold  ASSESSMENT:  CLINICAL IMPRESSION: 02/11/2024 Pt arrives w/o pain, continues to endorse steady progress overall. Today  spending majority of session in standing w/ closed chain activities which they tolerate well. They demonstrate excellent performance with progressions of postural stability tasks, and addition of wall sits + staggered wall sits for improved WB/LE endurance. No adverse events, state they feel good on departure. Recommend continuing along current POC in order to address relevant deficits and improve functional tolerance. Pt departs today's session in no acute distress, all voiced questions/concerns addressed appropriately from PT perspective.     Per eval - Patient is a 23 y.o. female who was seen today for physical therapy evaluation and treatment for s/p right ankle fracture.  11/12/23: OPEN TREATMENT RIGHT PILON ANKLE FRACTURE WITH FIBULA. Pt presents 8 weeks s/p Sx. The r ankle is swollen and moderately painful with decreased ROM. A HEP was developed. Pt will benefit from skilled PT 2w8 to address impairments to optimize R ankle function with less pain.   OBJECTIVE IMPAIRMENTS: decreased activity tolerance, difficulty walking, decreased ROM, decreased strength, and pain.   ACTIVITY LIMITATIONS: bending, standing, squatting, and locomotion level  PARTICIPATION LIMITATIONS: meal prep, cleaning, shopping, community activity, and occupation  PERSONAL FACTORS: Time since onset of injury/illness/exacerbation are also affecting  patient's functional outcome.   REHAB POTENTIAL: Good  CLINICAL DECISION MAKING: Evolving/moderate complexity  EVALUATION COMPLEXITY: Moderate   GOALS:   SHORT TERM GOALS: Target date: 01/29/24 Pt will be Ind in an initial HEP  Baseline:started Goal status: MET  2.  Increase R ankle AROM for DF to neutral PF by 10d  Baseline: see flow sheets Goal status: MET  LONG TERM GOALS: Target date: 03/18/24  Pt will be Ind in a final HEP to maintain achieved LOF  Baseline:  Goal status: INITIAL  2.  Increase R ankle AROM to with 85% of the L for proper function of the R ankle with ambulation Baseline: see flow sheets Goal status: INITIAL  3.  Pt will demonstrate R single leg balance within 85% of the L for proper R ankle /LE function Baseline:  Goal status: INITIAL  4. Improve 5xSTS by MCID of 5" and by MCID of 6ft as indication of improved functional mobility  Baseline: To assess when pt is walking with gait pattern exhibiting min gait deficits Goal status: INITIAL  5.  Pt  will demonstrate a normalized gait pattern for a distance of 1000' for community mobility Baseline:  Goal status: INITIAL  6.  Pt will be able to ascend/descend steps in a reciprocating pattern c 1 HR assist or less for improved function of the R ankle Baseline: Step to pattern Goal status: INITIAL  7. Pt's LEFS score will improved by at least the MCID to 46% as indication of improved function Baseline:: 33%  Goal Status: INITIAL   PLAN:  PT FREQUENCY: 2x/week  PT DURATION: 8 weeks  PLANNED INTERVENTIONS: 97164- PT Re-evaluation, 97110-Therapeutic exercises, 97530- Therapeutic activity, 97112- Neuromuscular re-education, 97535- Self Care, 16109- Manual therapy, [phone]- Gait training, [phone]- Electrical stimulation (unattended), Patient/Family education, Balance training, Stair training, Taping, Dry Needling, Joint mobilization, Scar mobilization, Cryotherapy, and Moist heat  PLAN FOR NEXT  SESSION: Assess response to HEP; progress therex as indicated; use of modalities, manual therapy; and TPDN as indicated.  Lovett Ruck PT, DPT 02/11/2024 8:44 AM

## 2024-02-11 ENCOUNTER — Ambulatory Visit: Attending: Orthopaedic Surgery | Admitting: Physical Therapy

## 2024-02-11 ENCOUNTER — Encounter: Payer: Self-pay | Admitting: Physical Therapy

## 2024-02-11 DIAGNOSIS — M6281 Muscle weakness (generalized): Secondary | ICD-10-CM | POA: Diagnosis present

## 2024-02-11 DIAGNOSIS — M25571 Pain in right ankle and joints of right foot: Secondary | ICD-10-CM | POA: Diagnosis present

## 2024-02-11 DIAGNOSIS — R262 Difficulty in walking, not elsewhere classified: Secondary | ICD-10-CM | POA: Insufficient documentation

## 2024-02-14 NOTE — Therapy (Signed)
 OUTPATIENT PHYSICAL THERAPY TREATMENT   Patient Name: Laurie [name] MRN: [id number] DOB:Jul 06, 2001, 23 y.o., adult Today's Date: 02/16/2024  END OF SESSION:  PT End of Session - 02/16/24 0735     Visit Number 9    Number of Visits 16    Authorization Type [place] MEDICAID [employer]    Authorization - Visit Number 9    Authorization - Number of Visits 27    PT Start Time 0728    PT Stop Time 0800    PT Time Calculation (min) 32 min    Activity Tolerance Patient tolerated treatment well    Behavior During Therapy [facility] for tasks assessed/performed               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/28/2018   Prediabetes 02/08/2016   Vitamin D  deficiency 12/21/2014   Past Surgical History:  Procedure Laterality Date   COLONOSCOPY WITH ESOPHAGOGASTRODUODENOSCOPY (EGD)  10/27/2022   ORIF ANKLE FRACTURE Right 11/12/2023   Procedure: OPEN TREATMENT RIGHT PILON ANKLE FRACTURE WITH FIBULA;  Surgeon: Donnamarie Gables, MD;  Location: [facility] OR;  Service: Orthopedics;  Laterality: Right;   Patient Active Problem List   Diagnosis Date Noted   Endocrine disorder, unspecified 11/21/2019   Menstrual cramps 02/08/2016   Frequent headaches 12/21/2014   BMI (body mass index), pediatric, greater than or equal to 95% for age 51/07/2015    PCP: Abraham Abo, MD  REFERRING PROVIDER: Donnamarie Gables, MD  REFERRING DIAG: s/p right ankle fracture   THERAPY DIAG:  Pain in right ankle and joints of right foot  Muscle weakness (generalized)  Difficulty in walking, not elsewhere classified  Rationale for Evaluation and Treatment: Rehabilitation  ONSET DATE: 11/02/23 Fx ankle rock climbing with fall from 15 ft. Sx 11/12/23 8 weeks s/p  SUBJECTIVE:   SUBJECTIVE STATEMENT: Pt reports she walked 4 miles yesterday and tolerated it well. The anterior ankle feels uncomfortable.but not painful.   PERTINENT HISTORY: See PMH  PAIN:   Are you having pain? Yes: NPRS scale: 0/10 Pain location: R ankle Pain description: ache Aggravating factors: prolonged time on feet Relieving factors: Cold pack  PRECAUTIONS: Other: WB in walking boot  RED FLAGS: None   WEIGHT BEARING RESTRICTIONS:  WB in walking boot  Call Dr. Verneita Goldmann office for status of Wt bearing exs in standing out of the walking boot while at PT  Per note from evaluating PT: "4/4 call MD who states no CKC WB out of boot yet. Only open chain out of boot at this time" Per pt report after MD visit 01/26/24 cleared to WBAT outside boot  FALLS:  Has patient fallen in last 6 months? Yes. 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 Hulda Mage  OBJECTIVE:  Note: Objective measures were completed at Evaluation unless otherwise noted.  DIAGNOSTIC FINDINGS:  DG R Ankle 11/12/23 IMPRESSION: Intraoperative fluoroscopic guidance for ORIF of the distal tibia and fibula.  PATIENT SURVEYS:  LEFS 26/80=33% ability, 67% disability  COGNITION: Overall cognitive status: Within functional limits for tasks assessed     SENSATION: WFL  EDEMA:  Significant swelling present  MUSCLE LENGTH: Hamstrings: Right WNLs deg; Left WNLs  deg Andy Bannister test: Right NT deg; Left NT deg  POSTURE: No Significant postural limitations  PALPATION: TTP of the R peri-anlke  LOWER EXTREMITY ROM:  Bilat hips and knees WNLs Active ROM Right eval Left eval RT 01/20/24 RT 01/28/24 Rt 02/04/24  Hip flexion       Hip extension       Hip abduction       Hip adduction       Hip internal rotation       Hip external rotation       Knee flexion       Knee extension       Ankle dorsiflexion A 10  lacking, P 0 A 13 A 3 lacking 0 A 4 A  Ankle plantarflexion A 20 A 40 32 A 32 A   Ankle inversion A 9 A 32 A 18    Ankle eversion A 12 A 21 A 14     (Blank rows = not tested)  LOWER EXTREMITY MMT:  Bilat hip and kness 5/5. R ankle NT MMT Right eval Left eval  Hip flexion    Hip extension    Hip abduction    Hip adduction    Hip internal rotation    Hip external rotation    Knee flexion    Knee extension    Ankle dorsiflexion    Ankle plantarflexion    Ankle inversion    Ankle eversion     (Blank rows = not tested)  LOWER EXTREMITY SPECIAL TESTS:  NT  FUNCTIONAL TESTS: TBA when able to walk with min gait deficit  5 times sit to stand: TBA 2 minute walk test: TBA  GAIT: Distance walked: 200" Assistive device utilized:  walking boot Level of assistance: Modified independence Comments: WNLs c walking boot                                                                                                          TREATMENT DATE:  [facility] Adult PT Treatment:          14 weeks s/p sx                                      DATE: 02/16/24 Therapeutic Exercise: Recumbent bike during subjective  Wall sit x28min Standing heel/toe raises x20 w/ min UE support  5xSTS Neuromuscular re-ed: Rocker board A/P, blue rocker board lateral High knee step c opposite leg PF Hurdle taps x20 cues for posture/pacing for SL stability  [facility] Adult PT Treatment:                                                DATE: 02/11/24 Therapeutic Exercise: Recumbent bike during subjective  Wall sit x54min Staggered wall sit RLE emphasis 3x30sec  Standing heel/toe raises x20 w/ min UE support  1 min soleus stretch 1 min gastroc stretch  Neuromuscular re-ed: Bosu lunge 3x12 RLE no UE support  Dynadisc step up w/ UE support 2x8 cues for reduced quad avoidance Hurdle taps x20 cues for posture/pacing for SL stability  [facility] Adult PT Treatment:                                                DATE:  02/08/24 Therapeutic Exercise:l Ankle DF/PF x15 Ankle circles CW/CCW x15 Standing gastroc and soleus stretches 2x each 1 min Therapeutic Activity: Rec bike 5 mins L3 5xSTS 13.3" 2 MWT 494' Lunges to bosu ball 2x15 Standing Ankle DF/PF x20  SL standing 30"+ SL balance on theraband green mat Wall slide squats 2x10   PATIENT EDUCATION:  Education details: rationale for interventions, HEP  Person educated: Patient Education method: Explanation, Demonstration, Tactile cues, Verbal cues Education comprehension: verbalized understanding, returned demonstration, verbal cues required, tactile cues required, and needs further education     HOME EXERCISE PROGRAM: Access Code: [email or web address] URL: [email or web address] Date: 01/28/2024 Prepared by: Liborio Reeds  Exercises - Long Sitting Calf Stretch with Strap  - 1-2 x daily - 7 x weekly - 1 sets - 3-5 reps - 30 hold - Long Sitting Ankle Pumps  - 1-2 x daily - 7 x weekly - 1-2 sets - 10 reps - Supine Ankle Circles  - 1-2 x daily - 7 x weekly - 1-2 sets - 10 reps - Towel Scrunches  - 1 x daily - 7 x weekly - 1 sets - 10 reps - Toe Yoga - Alternating Great Toe and Lesser Toe Extension  - 1 x daily - 7 x weekly - 1 sets - 10 reps - Ankle Plantar Flexion with Resistance  - 1 x daily - 7 x weekly - 1-2 sets - 10 reps - 2 hold - Long Sitting Ankle Eversion with Resistance (Mirrored)  - 1 x daily - 7 x weekly - 1-2 sets - 10 reps - 2 hold - Long Sitting Ankle Inversion with Resistance  - 1 x daily - 7 x weekly - 1-2 sets - 10 reps - 2 hold - Gastroc Stretch on Wall  - 1 x daily - 7 x weekly - 1 sets - 2-3 reps - 30 hold - Soleus Stretch on Wall  - 1 x daily - 7 x weekly - 1 sets - 2-3 reps - 30 hold - Standing Tandem Balance with Counter Support  - 1 x daily - 7 x weekly - 1 sets - 4 reps - 30 hold  ASSESSMENT:  CLINICAL IMPRESSION: 02/16/2024 PT focused on dynamic stability and balance, and assessed pt's 5xSTS and for the first  time. Both were found WNLs, and pt did not demonstrate a limp/antalgic gait pattern during the walk. Treatment time was limited due to pt arriving late. Will progress pt to 2 footed plyometrics her next PT visit. Pt tolerated prescribed PT today without adverse effects.   Per eval - Patient is a 23 y.o. female who was seen today for physical therapy evaluation and treatment for s/p right ankle fracture.  11/12/23: OPEN TREATMENT RIGHT PILON ANKLE FRACTURE WITH FIBULA. Pt presents 8 weeks s/p Sx. The r ankle is swollen and moderately painful with decreased ROM. A HEP was developed. Pt will benefit from skilled PT 2w8 to address impairments to optimize R ankle  function with less pain.   OBJECTIVE IMPAIRMENTS: decreased activity tolerance, difficulty walking, decreased ROM, decreased strength, and pain.   ACTIVITY LIMITATIONS: bending, standing, squatting, and locomotion level  PARTICIPATION LIMITATIONS: meal prep, cleaning, shopping, community activity, and occupation  PERSONAL FACTORS: Time since onset of injury/illness/exacerbation are also affecting patient's functional outcome.   REHAB POTENTIAL: Good  CLINICAL DECISION MAKING: Evolving/moderate complexity  EVALUATION COMPLEXITY: Moderate   GOALS:   SHORT TERM GOALS: Target date: 01/29/24 Pt will be Ind in an initial HEP  Baseline:started Goal status: MET  2.  Increase R ankle AROM for DF to neutral PF by 10d  Baseline: see flow sheets Goal status: MET  LONG TERM GOALS: Target date: 03/18/24  Pt will be Ind in a final HEP to maintain achieved LOF  Baseline:  Goal status: INITIAL  2.  Increase R ankle AROM to with 85% of the L for proper function of the R ankle with ambulation Baseline: see flow sheets Goal status: INITIAL  3.  Pt will demonstrate R single leg balance within 85% of the L for proper R ankle /LE function Baseline:  Goal status: INITIAL  4. Improve 5xSTS by MCID of 5" and by MCID of 76ft as indication of  improved functional mobility  Baseline: To assess when pt is walking with gait pattern exhibiting min gait deficits 02/16/24: 7.8" WNLs, 663 WNLs Goal status: MET- both are within age norms  5.  Pt  will demonstrate a normalized gait pattern for a distance of 1000' for community mobility Baseline:  Goal status: INITIAL  6.  Pt will be able to ascend/descend steps in a reciprocating pattern c 1 HR assist or less for improved function of the R ankle Baseline: Step to pattern Goal status: INITIAL  7. Pt's LEFS score will improved by at least the MCID to 46% as indication of improved function Baseline:: 33%  Goal Status: INITIAL   PLAN:  PT FREQUENCY: 2x/week  PT DURATION: 8 weeks  PLANNED INTERVENTIONS: 97164- PT Re-evaluation, 97110-Therapeutic exercises, 97530- Therapeutic activity, 97112- Neuromuscular re-education, 97535- Self Care, 40981- Manual therapy, [phone]- Gait training, [phone]- Electrical stimulation (unattended), Patient/Family education, Balance training, Stair training, Taping, Dry Needling, Joint mobilization, Scar mobilization, Cryotherapy, and Moist heat  PLAN FOR NEXT SESSION: Assess response to HEP; progress therex as indicated; use of modalities, manual therapy; and TPDN as indicated.  Donnis Phaneuf MS, PT 02/16/24 12:57 PM

## 2024-02-16 ENCOUNTER — Ambulatory Visit: Admitting: Obstetrics & Gynecology

## 2024-02-16 ENCOUNTER — Ambulatory Visit

## 2024-02-16 ENCOUNTER — Encounter: Payer: Self-pay | Admitting: Obstetrics & Gynecology

## 2024-02-16 VITALS — BP 125/70 | HR 82 | Ht 61.0 in | Wt 154.9 lb

## 2024-02-16 DIAGNOSIS — Z30017 Encounter for initial prescription of implantable subdermal contraceptive: Secondary | ICD-10-CM | POA: Diagnosis not present

## 2024-02-16 DIAGNOSIS — Z3202 Encounter for pregnancy test, result negative: Secondary | ICD-10-CM

## 2024-02-16 DIAGNOSIS — M25571 Pain in right ankle and joints of right foot: Secondary | ICD-10-CM | POA: Diagnosis not present

## 2024-02-16 DIAGNOSIS — M6281 Muscle weakness (generalized): Secondary | ICD-10-CM

## 2024-02-16 DIAGNOSIS — R262 Difficulty in walking, not elsewhere classified: Secondary | ICD-10-CM

## 2024-02-16 LAB — POCT URINE PREGNANCY: Preg Test, Ur: NEGATIVE

## 2024-02-16 MED ORDER — ETONOGESTREL 68 MG ~~LOC~~ IMPL
68.0000 mg | DRUG_IMPLANT | Freq: Once | SUBCUTANEOUS | Status: AC
  Administered 2024-02-16: 68 mg via SUBCUTANEOUS

## 2024-02-16 NOTE — Progress Notes (Signed)
 GYNECOLOGY OFFICE PROCEDURE NOTE  Laurie [name] is a 23 y.o. G0P0000 here for Nexplanon insertion.  Last pap smear was on 01/27/24 and was LGSIL.  No other gynecologic concerns.  Nexplanon Insertion Procedure Patient identified, informed consent performed, consent signed.   Patient does understand that irregular bleeding is a very common side effect of this medication. She was advised to have backup contraception for one week after placement. Pregnancy test in clinic today was negative.  Appropriate time out taken.  Patient's left arm was prepped and draped in the usual sterile fashion. The ruler used to measure and mark insertion area.  Patient was prepped with alcohol swab and then injected with 3 ml of 1% lidocaine .  She was prepped with betadine , Nexplanon removed from packaging,  Device confirmed in needle, then inserted full length of needle and withdrawn per handbook instructions. Nexplanon was able to palpated in the patient's arm; patient palpated the insert herself. There was minimal blood loss.  Patient insertion site covered with gauze and a pressure bandage to reduce any bruising.  The patient tolerated the procedure well and was given post procedure instructions.      Tresia Fruit, MD Attending Obstetrician & Gynecologist, [facility] and [facility] for [facility]   02/16/2024

## 2024-02-16 NOTE — Progress Notes (Signed)
 Pt presents for nexplanon implant. No questions or concerns today. Scheduled for colposcopy in July.

## 2024-02-17 NOTE — Therapy (Signed)
 OUTPATIENT PHYSICAL THERAPY TREATMENT   Patient Name: Laurie [name] MRN: [id number] DOB:2001-06-27, 23 y.o., adult Today's Date: 02/18/2024  END OF SESSION:  PT End of Session - 02/18/24 0809     Visit Number 10    Number of Visits 16    Date for PT Re-Evaluation 03/18/24    Authorization Type [place] MEDICAID [employer]    Authorization - Visit Number 10    Authorization - Number of Visits 27    PT Start Time 0803    PT Stop Time 0844    PT Time Calculation (min) 41 min    Activity Tolerance Patient tolerated treatment well    Behavior During Therapy [facility] for tasks assessed/performed                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/28/2018   Prediabetes 02/08/2016   Vitamin D  deficiency 12/21/2014   Past Surgical History:  Procedure Laterality Date   COLONOSCOPY WITH ESOPHAGOGASTRODUODENOSCOPY (EGD)  10/27/2022   ORIF ANKLE FRACTURE Right 11/12/2023   Procedure: OPEN TREATMENT RIGHT PILON ANKLE FRACTURE WITH FIBULA;  Surgeon: Donnamarie Gables, MD;  Location: [facility] OR;  Service: Orthopedics;  Laterality: Right;   Patient Active Problem List   Diagnosis Date Noted   Endocrine disorder, unspecified 11/21/2019   Menstrual cramps 02/08/2016   Frequent headaches 12/21/2014    PCP: Abraham Abo, MD  REFERRING PROVIDER: Donnamarie Gables, MD  REFERRING DIAG: s/p right ankle fracture   THERAPY DIAG:  Pain in right ankle and joints of right foot  Muscle weakness (generalized)  Difficulty in walking, not elsewhere classified  Rationale for Evaluation and Treatment: Rehabilitation  ONSET DATE: 11/02/23 Fx ankle rock climbing with fall from 15 ft. Sx 11/12/23 8 weeks s/p  SUBJECTIVE:   SUBJECTIVE STATEMENT: Pt reports she ran/walked fast for 5 mins in the rain the other day and her R ankle did well.   PERTINENT HISTORY: See PMH  PAIN:  Are you having pain? Yes: NPRS scale: 0/10 Pain location: R  ankle Pain description: ache Aggravating factors: prolonged time on feet Relieving factors: Cold pack  PRECAUTIONS: Other: WB in walking boot  RED FLAGS: None   WEIGHT BEARING RESTRICTIONS: WB in walking boot Call Dr. Verneita Goldmann office for status of Wt bearing exs in standing out of the walking boot while at PT  Per note from evaluating PT: "4/4 call MD who states no CKC WB out of boot yet. Only open chain out of boot at this time" Per pt report after MD visit 01/26/24 cleared to WBAT outside boot  FALLS:  Has patient fallen in last 6 months? Yes. 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 Hulda Mage  OBJECTIVE:  Note: Objective measures were completed at Evaluation unless otherwise noted.  DIAGNOSTIC FINDINGS:  DG R Ankle 11/12/23 IMPRESSION: Intraoperative fluoroscopic guidance for ORIF of the distal tibia and fibula.  PATIENT SURVEYS:  LEFS 26/80=33% ability, 67% disability  COGNITION: Overall cognitive status: Within functional limits for tasks assessed     SENSATION: WFL  EDEMA:  Significant swelling present  MUSCLE LENGTH: Hamstrings: Right WNLs deg; Left WNLs deg Andy Bannister test: Right NT deg; Left NT  deg  POSTURE: No Significant postural limitations  PALPATION: TTP of the R peri-anlke  LOWER EXTREMITY ROM:  Bilat hips and knees WNLs Active ROM Right eval Left eval RT 01/20/24 RT 01/28/24 Rt 02/04/24  Hip flexion       Hip extension       Hip abduction       Hip adduction       Hip internal rotation       Hip external rotation       Knee flexion       Knee extension       Ankle dorsiflexion A 10 lacking, P 0 A 13 A 3 lacking 0 A 4 A  Ankle plantarflexion A  20 A 40 32 A 32 A   Ankle inversion A 9 A 32 A 18    Ankle eversion A 12 A 21 A 14     (Blank rows = not tested)  LOWER EXTREMITY MMT:  Bilat hip and kness 5/5. R ankle NT MMT Right eval Left eval  Hip flexion    Hip extension    Hip abduction    Hip adduction    Hip internal rotation    Hip external rotation    Knee flexion    Knee extension    Ankle dorsiflexion    Ankle plantarflexion    Ankle inversion    Ankle eversion     (Blank rows = not tested)  LOWER EXTREMITY SPECIAL TESTS:  NT  FUNCTIONAL TESTS: TBA when able to walk with min gait deficit  5 times sit to stand: TBA 2 minute walk test: TBA  GAIT: Distance walked: 200" Assistive device utilized: walking boot Level of assistance: Modified independence Comments: WNLs c walking boot                                                                                                          TREATMENT DATE:  [facility] Adult PT Treatment:             14 weeks s/p sx                           DATE: 02/18/24 Therapeutic Activity: Recumbent bike during subjective  Wall sit x59min Wall slides x10 Banded side steps and monster walks RTB at ankles Standing heel/toe raises off step x15 iblat, 2x5  LE w/ min UE support  Neuromuscular re-ed: Fwd step ups/backs R LE Fwd step downs/ups R LE, light hand assist High knee step c opposite leg PF Fwd/Bwd and laterally 2x10, small jumps  [facility] Adult PT Treatment:          14 weeks s/p sx                                      DATE: 02/16/24 Therapeutic Exercise: Recumbent bike during subjective  Wall sit x52min Standing heel/toe raises x20 w/ min UE support  5xSTS  Neuromuscular re-ed: Rocker board A/P, blue rocker board lateral High knee step c opposite leg PF Hurdle taps x20 cues for posture/pacing for SL stability  PATIENT EDUCATION:  Education details: rationale for interventions, HEP  Person educated: Patient Education method: Explanation, Demonstration, Tactile  cues, Verbal cues Education comprehension: verbalized understanding, returned demonstration, verbal cues required, tactile cues required, and needs further education     HOME EXERCISE PROGRAM: Access Code: [email or web address] URL: [email or web address] Date: 01/28/2024 Prepared by: Liborio Reeds  Exercises - Long Sitting Calf Stretch with Strap  - 1-2 x daily - 7 x weekly - 1 sets - 3-5 reps - 30 hold - Long Sitting Ankle Pumps  - 1-2 x daily - 7 x weekly - 1-2 sets - 10 reps - Supine Ankle Circles  - 1-2 x daily - 7 x weekly - 1-2 sets - 10 reps - Towel Scrunches  - 1 x daily - 7 x weekly - 1 sets - 10 reps - Toe Yoga - Alternating Great Toe and Lesser Toe Extension  - 1 x daily - 7 x weekly - 1 sets - 10 reps - Ankle Plantar Flexion with Resistance  - 1 x daily - 7 x weekly - 1-2 sets - 10 reps - 2 hold - Long Sitting Ankle Eversion with Resistance (Mirrored)  - 1 x daily - 7 x weekly - 1-2 sets - 10 reps - 2 hold - Long Sitting Ankle Inversion with Resistance  - 1 x daily - 7 x weekly - 1-2 sets - 10 reps - 2 hold - Gastroc Stretch on Wall  - 1 x daily - 7 x weekly - 1 sets - 2-3 reps - 30 hold - Soleus Stretch on Wall  - 1 x daily - 7 x weekly - 1 sets - 2-3 reps - 30 hold - Standing Tandem Balance with Counter Support  - 1 x daily - 7 x weekly - 1 sets - 4 reps - 30 hold  ASSESSMENT:  CLINICAL IMPRESSION: 02/18/2024 PT continued on dynamic stability and balance. Initiated 2 footed plyometric exercises to good effect. R SL PF/DF off step ex was difficult due to weakness. Overall, pt continues to make good progress in all areas with progressed functional mobility. Pt tolerated PT today without adverse effects. Pt will continue to benefit from skilled PT to address impairments for improved R ankle/LE function.    Per eval - Patient is a 23 y.o. female who was seen today for physical therapy evaluation and treatment for s/p right ankle fracture.  11/12/23: OPEN TREATMENT RIGHT PILON ANKLE  FRACTURE WITH FIBULA. Pt presents 8 weeks s/p Sx. The r ankle is swollen and moderately painful with decreased ROM. A HEP was developed. Pt will benefit from skilled PT 2w8 to address impairments to optimize R ankle function with less pain.   OBJECTIVE IMPAIRMENTS: decreased activity tolerance, difficulty walking, decreased ROM, decreased strength, and pain.   ACTIVITY LIMITATIONS: bending, standing, squatting, and locomotion level  PARTICIPATION LIMITATIONS: meal prep, cleaning, shopping, community activity, and occupation  PERSONAL FACTORS: Time since onset of injury/illness/exacerbation are also affecting patient's functional outcome.   REHAB POTENTIAL: Good  CLINICAL DECISION MAKING: Evolving/moderate complexity  EVALUATION COMPLEXITY: Moderate   GOALS:   SHORT TERM GOALS: Target date: 01/29/24 Pt will be Ind in an initial HEP  Baseline:started Goal status: MET  2.  Increase R ankle AROM for DF to neutral PF by 10d  Baseline: see flow sheets Goal status: MET  LONG TERM GOALS: Target  date: 03/18/24  Pt will be Ind in a final HEP to maintain achieved LOF  Baseline:  Goal status: INITIAL  2.  Increase R ankle AROM to with 85% of the L for proper function of the R ankle with ambulation Baseline: see flow sheets Goal status: INITIAL  3.  Pt will demonstrate R single leg balance within 85% of the L for proper R ankle /LE function Baseline:  Goal status: INITIAL  4. Improve 5xSTS by MCID of 5" and by MCID of 73ft as indication of improved functional mobility  Baseline: To assess when pt is walking with gait pattern exhibiting min gait deficits 02/16/24: 7.8" WNLs, 663 WNLs Goal status: MET- both are within age norms  5.  Pt  will demonstrate a normalized gait pattern for a distance of 1000' for community mobility Baseline:  Goal status: INITIAL  6.  Pt will be able to ascend/descend steps in a reciprocating pattern c 1 HR assist or less for improved function of the R  ankle Baseline: Step to pattern Goal status: INITIAL  7. Pt's LEFS score will improved by at least the MCID to 46% as indication of improved function Baseline:: 33%  Goal Status: INITIAL   PLAN:  PT FREQUENCY: 2x/week  PT DURATION: 8 weeks  PLANNED INTERVENTIONS: 97164- PT Re-evaluation, 97110-Therapeutic exercises, 97530- Therapeutic activity, 97112- Neuromuscular re-education, 97535- Self Care, 16109- Manual therapy, [phone]- Gait training, [phone]- Electrical stimulation (unattended), Patient/Family education, Balance training, Stair training, Taping, Dry Needling, Joint mobilization, Scar mobilization, Cryotherapy, and Moist heat  PLAN FOR NEXT SESSION: Assess response to HEP; progress therex as indicated; use of modalities, manual therapy; and TPDN as indicated.  Coner Gibbard MS, PT 02/18/24 8:48 AM

## 2024-02-18 ENCOUNTER — Ambulatory Visit

## 2024-02-18 ENCOUNTER — Encounter: Admitting: Obstetrics & Gynecology

## 2024-02-18 DIAGNOSIS — M25571 Pain in right ankle and joints of right foot: Secondary | ICD-10-CM

## 2024-02-18 DIAGNOSIS — R262 Difficulty in walking, not elsewhere classified: Secondary | ICD-10-CM

## 2024-02-18 DIAGNOSIS — M6281 Muscle weakness (generalized): Secondary | ICD-10-CM

## 2024-02-24 NOTE — Therapy (Signed)
 OUTPATIENT PHYSICAL THERAPY TREATMENT   Patient Name: Laurie [name] MRN: [id number] DOB:2001-08-06, 23 y.o., adult Today's Date: 02/25/2024  END OF SESSION:  PT End of Session - 02/25/24 0834     Visit Number 11    Number of Visits 16    Date for PT Re-Evaluation 03/18/24    Authorization Type [place] MEDICAID [employer]    Authorization - Visit Number 11    Authorization - Number of Visits 27    PT Start Time [phone]   late check in   PT Stop Time 0913    PT Time Calculation (min) 39 min    Activity Tolerance Patient tolerated treatment well                 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/28/2018   Prediabetes 02/08/2016   Vitamin D  deficiency 12/21/2014   Past Surgical History:  Procedure Laterality Date   COLONOSCOPY WITH ESOPHAGOGASTRODUODENOSCOPY (EGD)  10/27/2022   ORIF ANKLE FRACTURE Right 11/12/2023   Procedure: OPEN TREATMENT RIGHT PILON ANKLE FRACTURE WITH FIBULA;  Surgeon: Donnamarie Gables, MD;  Location: [facility] OR;  Service: Orthopedics;  Laterality: Right;   Patient Active Problem List   Diagnosis Date Noted   Endocrine disorder, unspecified 11/21/2019   Menstrual cramps 02/08/2016   Frequent headaches 12/21/2014    PCP: Abraham Abo, MD  REFERRING PROVIDER: Donnamarie Gables, MD  REFERRING DIAG: s/p right ankle fracture   THERAPY DIAG:  Pain in right ankle and joints of right foot  Muscle weakness (generalized)  Difficulty in walking, not elsewhere classified  Rationale for Evaluation and Treatment: Rehabilitation  ONSET DATE: 11/02/23 Fx ankle rock climbing with fall from 15 ft. Sx 11/12/23 8 weeks s/p  SUBJECTIVE:   SUBJECTIVE STATEMENT: Pt states they have been feeling pretty good, not really using brace at this point. States they will get some soreness at night the past couple of weeks, tends to do better with repositioning/pillow. Sees surgeon next week. 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 Yes: NPRS scale: 0/10 Pain location: R ankle Pain description: ache Aggravating factors: prolonged time on feet Relieving factors: Cold pack  PRECAUTIONS: Other: WBAT  RED FLAGS: None   WEIGHT BEARING RESTRICTIONS: WB in walking boot Call Dr. Verneita Goldmann office for status of Wt bearing exs in standing out of the walking boot while at PT  Per note from evaluating PT: "4/4 call MD who states no CKC WB out of boot yet. Only open chain out of boot at this time" Per pt report after MD visit 01/26/24 cleared to WBAT outside boot  FALLS:  Has patient fallen in last 6 months? Yes. 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 Hulda Mage  OBJECTIVE:  Note: Objective measures were completed at Evaluation unless otherwise noted.  DIAGNOSTIC FINDINGS:  DG R Ankle 11/12/23 IMPRESSION: Intraoperative fluoroscopic guidance for ORIF of the distal tibia and fibula.  PATIENT SURVEYS:  LEFS 26/80=33% ability, 67% disability  COGNITION: Overall cognitive status: Within functional limits for tasks assessed  SENSATION: WFL  EDEMA:  Significant swelling present  MUSCLE LENGTH: Hamstrings: Right WNLs deg; Left WNLs deg Andy Bannister test: Right NT deg; Left NT deg  POSTURE: No Significant postural limitations  PALPATION: TTP of the R peri-anlke  LOWER EXTREMITY ROM:  Bilat hips and knees WNLs Active ROM Right eval Left eval RT 01/20/24 RT 01/28/24 Rt 02/04/24  Hip flexion       Hip extension       Hip abduction       Hip adduction       Hip internal rotation        Hip external rotation       Knee flexion       Knee extension       Ankle dorsiflexion A 10 lacking, P 0 A 13 A 3 lacking 0 A 4 A  Ankle plantarflexion A 20 A 40 32 A 32 A   Ankle inversion A 9 A 32 A 18    Ankle eversion A 12 A 21 A 14     (Blank rows = not tested)  LOWER EXTREMITY MMT:  Bilat hip and kness 5/5. R ankle NT MMT Right eval Left eval  Hip flexion    Hip extension    Hip abduction    Hip adduction    Hip internal rotation    Hip external rotation    Knee flexion    Knee extension    Ankle dorsiflexion    Ankle plantarflexion    Ankle inversion    Ankle eversion     (Blank rows = not tested)  LOWER EXTREMITY SPECIAL TESTS:  NT  FUNCTIONAL TESTS: TBA when able to walk with min gait deficit  5 times sit to stand: TBA 2 minute walk test: TBA  GAIT: Distance walked: 200" Assistive device utilized: walking boot Level of assistance: Modified independence Comments: WNLs c walking boot                                                                                                          TREATMENT DATE:  [facility] Adult PT Treatment:                                                DATE: 02/25/24 Therapeutic Exercise: Recumbent bike R3 Staggered wall sit 2x76min RLE back  Up on 2 down w/ RLE bias heel raise 2x8   HEP update+ education  Neuromuscular re-ed: 8 inch lateral step up/down w/ UE support cues for hip mechanics 2x12 5#KB SL RDL w/ UE support 2x10 cues for pelvic and trunk positioning,knee positioning Inc time w/ education/discussion to ensure appropriate positioning, understanding of relevant anatomy/physiology    [facility] Adult PT Treatment:             14 weeks s/p sx  DATE: 02/18/24 Therapeutic Activity: Recumbent bike during subjective  Wall sit x20min Wall slides x10 Banded side steps and monster walks RTB at ankles Standing heel/toe raises off step x15 iblat, 2x5  LE w/ min UE support  Neuromuscular re-ed: Fwd  step ups/backs R LE Fwd step downs/ups R LE, light hand assist High knee step c opposite leg PF Fwd/Bwd and laterally 2x10, small jumps  [facility] Adult PT Treatment:          14 weeks s/p sx                                      DATE: 02/16/24 Therapeutic Exercise: Recumbent bike during subjective  Wall sit x49min Standing heel/toe raises x20 w/ min UE support  5xSTS Neuromuscular re-ed: Rocker board A/P, blue rocker board lateral High knee step c opposite leg PF Hurdle taps x20 cues for posture/pacing for SL stability  PATIENT EDUCATION:  Education details: rationale for interventions, HEP  Person educated: Patient Education method: Explanation, Demonstration, Tactile cues, Verbal cues Education comprehension: verbalized understanding, returned demonstration, verbal cues required, tactile cues required, and needs further education     HOME EXERCISE PROGRAM: Access Code: [email or web address] URL: [email or web address] Date: 02/25/2024 Prepared by: Mayme Spearman  Exercises - Long Sitting Calf Stretch with Strap  - 1-2 x daily - 7 x weekly - 1 sets - 3-5 reps - 30 hold - Supine Ankle Circles  - 1-2 x daily - 7 x weekly - 1-2 sets - 10 reps - Towel Scrunches  - 1 x daily - 7 x weekly - 1 sets - 10 reps - Toe Yoga - Alternating Great Toe and Lesser Toe Extension  - 1 x daily - 7 x weekly - 1 sets - 10 reps - Ankle Plantar Flexion with Resistance  - 1 x daily - 7 x weekly - 1-2 sets - 10 reps - 2 hold - Long Sitting Ankle Eversion with Resistance (Mirrored)  - 1 x daily - 7 x weekly - 1-2 sets - 10 reps - 2 hold - Long Sitting Ankle Inversion with Resistance  - 1 x daily - 7 x weekly - 1-2 sets - 10 reps - 2 hold - Gastroc Stretch on Wall  - 1 x daily - 7 x weekly - 1 sets - 2-3 reps - 30 hold - Soleus Stretch on Wall  - 1 x daily - 7 x weekly - 1 sets - 2-3 reps - 30 hold - Standing Tandem Balance with Counter Support  - 1 x daily - 7 x weekly - 1 sets - 4 reps - 30 hold -  Standing Heel Raise with Support  - 1 x daily - 7 x weekly - 2 sets - 8-10 reps - Side Step Down with Counter Support  - 1 x daily - 3-4 x weekly - 2 sets - 10 reps  ASSESSMENT:  CLINICAL IMPRESSION: 02/25/2024 Pt arrives w/o resting pain, continues to endorse steady progress. Today focusing on progression of SL stability training w/ RDL and lateral step down, inc time to ensure appropriate mechanics. Also progressing heel raise to inc RLE bias. They tolerate activity quite well, no increase in pain. State they see surgeon next week, encouraged to discuss ongoing precautions and their aim of addressing appropriate fall mechanics at that visit. No adverse events, departs without pain. Recommend continuing along current POC in order to address relevant  deficits and improve functional tolerance. Pt departs today's session in no acute distress, all voiced questions/concerns addressed appropriately from PT perspective.     Per eval - Patient is a 23 y.o. female who was seen today for physical therapy evaluation and treatment for s/p right ankle fracture.  11/12/23: OPEN TREATMENT RIGHT PILON ANKLE FRACTURE WITH FIBULA. Pt presents 8 weeks s/p Sx. The r ankle is swollen and moderately painful with decreased ROM. A HEP was developed. Pt will benefit from skilled PT 2w8 to address impairments to optimize R ankle function with less pain.   OBJECTIVE IMPAIRMENTS: decreased activity tolerance, difficulty walking, decreased ROM, decreased strength, and pain.   ACTIVITY LIMITATIONS: bending, standing, squatting, and locomotion level  PARTICIPATION LIMITATIONS: meal prep, cleaning, shopping, community activity, and occupation  PERSONAL FACTORS: Time since onset of injury/illness/exacerbation are also affecting patient's functional outcome.   REHAB POTENTIAL: Good  CLINICAL DECISION MAKING: Evolving/moderate complexity  EVALUATION COMPLEXITY: Moderate   GOALS:   SHORT TERM GOALS: Target date:  01/29/24 Pt will be Ind in an initial HEP  Baseline:started Goal status: MET  2.  Increase R ankle AROM for DF to neutral PF by 10d  Baseline: see flow sheets Goal status: MET  LONG TERM GOALS: Target date: 03/18/24  Pt will be Ind in a final HEP to maintain achieved LOF  Baseline:  Goal status: INITIAL  2.  Increase R ankle AROM to with 85% of the L for proper function of the R ankle with ambulation Baseline: see flow sheets Goal status: INITIAL  3.  Pt will demonstrate R single leg balance within 85% of the L for proper R ankle /LE function Baseline:  Goal status: INITIAL  4. Improve 5xSTS by MCID of 5" and by MCID of 69ft as indication of improved functional mobility  Baseline: To assess when pt is walking with gait pattern exhibiting min gait deficits 02/16/24: 7.8" WNLs, 663 WNLs Goal status: MET- both are within age norms  5.  Pt  will demonstrate a normalized gait pattern for a distance of 1000' for community mobility Baseline:  Goal status: INITIAL  6.  Pt will be able to ascend/descend steps in a reciprocating pattern c 1 HR assist or less for improved function of the R ankle Baseline: Step to pattern Goal status: INITIAL  7. Pt's LEFS score will improved by at least the MCID to 46% as indication of improved function Baseline:: 33%  Goal Status: INITIAL   PLAN:  PT FREQUENCY: 2x/week  PT DURATION: 8 weeks  PLANNED INTERVENTIONS: 97164- PT Re-evaluation, 97110-Therapeutic exercises, 97530- Therapeutic activity, 97112- Neuromuscular re-education, 97535- Self Care, 16109- Manual therapy, [phone]- Gait training, [phone]- Electrical stimulation (unattended), Patient/Family education, Balance training, Stair training, Taping, Dry Needling, Joint mobilization, Scar mobilization, Cryotherapy, and Moist heat  PLAN FOR NEXT SESSION: Assess response to HEP; progress therex as indicated; use of modalities, manual therapy; and TPDN as indicated.  Lovett Ruck PT,  DPT 02/25/2024 9:58 AM

## 2024-02-25 ENCOUNTER — Ambulatory Visit: Admitting: Physical Therapy

## 2024-02-25 ENCOUNTER — Encounter: Payer: Self-pay | Admitting: Physical Therapy

## 2024-02-25 DIAGNOSIS — M25571 Pain in right ankle and joints of right foot: Secondary | ICD-10-CM | POA: Diagnosis not present

## 2024-02-25 DIAGNOSIS — M6281 Muscle weakness (generalized): Secondary | ICD-10-CM

## 2024-02-25 DIAGNOSIS — R262 Difficulty in walking, not elsewhere classified: Secondary | ICD-10-CM

## 2024-02-29 ENCOUNTER — Ambulatory Visit: Admitting: Physical Therapy

## 2024-02-29 DIAGNOSIS — S82871A Displaced pilon fracture of right tibia, initial encounter for closed fracture: Secondary | ICD-10-CM | POA: Diagnosis not present

## 2024-03-02 ENCOUNTER — Encounter: Payer: Self-pay | Admitting: Physical Therapy

## 2024-03-02 ENCOUNTER — Ambulatory Visit: Admitting: Physical Therapy

## 2024-03-02 DIAGNOSIS — M25571 Pain in right ankle and joints of right foot: Secondary | ICD-10-CM | POA: Diagnosis not present

## 2024-03-02 DIAGNOSIS — M6281 Muscle weakness (generalized): Secondary | ICD-10-CM

## 2024-03-02 DIAGNOSIS — R262 Difficulty in walking, not elsewhere classified: Secondary | ICD-10-CM

## 2024-03-02 NOTE — Therapy (Signed)
 OUTPATIENT PHYSICAL THERAPY TREATMENT   Patient Name: Laurie [name] MRN: [id number] DOB:[birth year], 23 y.o., adult Today's Date: 03/02/2024  END OF SESSION:  PT End of Session - 03/02/24 1046     Visit Number 12    Number of Visits 16    Date for PT Re-Evaluation 03/18/24    Authorization Type [place] MEDICAID [employer]    Authorization - Visit Number 12    Authorization - Number of Visits 27    PT Start Time 1046    PT Stop Time 1129    PT Time Calculation (min) 43 min    Activity Tolerance Patient tolerated treatment well                  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/28/2018   Prediabetes 02/08/2016   Vitamin D  deficiency 12/21/2014   Past Surgical History:  Procedure Laterality Date   COLONOSCOPY WITH ESOPHAGOGASTRODUODENOSCOPY (EGD)  10/27/2022   ORIF ANKLE FRACTURE Right 11/12/2023   Procedure: OPEN TREATMENT RIGHT PILON ANKLE FRACTURE WITH FIBULA;  Surgeon: Donnamarie Gables, MD;  Location: [facility] OR;  Service: Orthopedics;  Laterality: Right;   Patient Active Problem List   Diagnosis Date Noted   Endocrine disorder, unspecified 11/21/2019   Menstrual cramps 02/08/2016   Frequent headaches 12/21/2014    PCP: Abraham Abo, MD  REFERRING PROVIDER: Donnamarie Gables, MD  REFERRING DIAG: s/p right ankle fracture   THERAPY DIAG:  Pain in right ankle and joints of right foot  Muscle weakness (generalized)  Difficulty in walking, not elsewhere classified  Rationale for Evaluation and Treatment: Rehabilitation  ONSET DATE: 11/02/23 Fx ankle rock climbing with fall from 15 ft. Sx 11/12/23 8 weeks s/p  SUBJECTIVE:   SUBJECTIVE STATEMENT: 03/02/2024 states surgeon follow up went well, says they may consider injections going forward. Cleared for activity as tolerated. No pain at present. Wants to work on fall practice    PERTINENT HISTORY: See PMH  PAIN:  Are you having pain? Yes: NPRS  scale: 0/10 Pain location: R ankle Pain description: ache Aggravating factors: prolonged time on feet Relieving factors: Cold pack  PRECAUTIONS: Other: WBAT  RED FLAGS: None   WEIGHT BEARING RESTRICTIONS: WB in walking boot Call Dr. Verneita Goldmann office for status of Wt bearing exs in standing out of the walking boot while at PT  Per note from evaluating PT: "4/4 call MD who states no CKC WB out of boot yet. Only open chain out of boot at this time" Per pt report after MD visit 01/26/24 cleared to [facility] outside boot Per surgeon communication 03/02/24 via secure chat: okay to progress activity as tolerated, they recommend slow progression with jumping/hopping  FALLS:  Has patient fallen in last 6 months? Yes. 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 Hulda Mage  OBJECTIVE:  Note: Objective measures were completed at Evaluation unless otherwise noted.  DIAGNOSTIC FINDINGS:  DG R Ankle 11/12/23 IMPRESSION: Intraoperative fluoroscopic guidance for ORIF of the distal tibia and fibula.  PATIENT SURVEYS:  LEFS 26/80=33% ability, 67% disability  COGNITION: Overall cognitive status: Within functional limits for tasks assessed     SENSATION: WFL  EDEMA:  Significant swelling  present  MUSCLE LENGTH: Hamstrings: Right WNLs deg; Left WNLs deg Andy Bannister test: Right NT deg; Left NT deg  POSTURE: No Significant postural limitations  PALPATION: TTP of the R peri-anlke  LOWER EXTREMITY ROM:  Bilat hips and knees WNLs Active ROM Right eval Left eval RT 01/20/24 RT 01/28/24 Rt 02/04/24  Hip flexion       Hip extension       Hip abduction       Hip adduction       Hip internal rotation        Hip external rotation       Knee flexion       Knee extension       Ankle dorsiflexion A 10 lacking, P 0 A 13 A 3 lacking 0 A 4 A  Ankle plantarflexion A 20 A 40 32 A 32 A   Ankle inversion A 9 A 32 A 18    Ankle eversion A 12 A 21 A 14     (Blank rows = not tested)  LOWER EXTREMITY MMT:  Bilat hip and kness 5/5. R ankle NT MMT Right eval Left eval  Hip flexion    Hip extension    Hip abduction    Hip adduction    Hip internal rotation    Hip external rotation    Knee flexion    Knee extension    Ankle dorsiflexion    Ankle plantarflexion    Ankle inversion    Ankle eversion     (Blank rows = not tested)  LOWER EXTREMITY SPECIAL TESTS:  NT  FUNCTIONAL TESTS: TBA when able to walk with min gait deficit  5 times sit to stand: TBA 2 minute walk test: TBA  GAIT: Distance walked: 200" Assistive device utilized: walking boot Level of assistance: Modified independence Comments: WNLs c walking boot                                                                                                          TREATMENT DATE:  [facility] Adult PT Treatment:                                                DATE: 03/02/24 Therapeutic Exercise: Recumbent bike warmup Kickstand RLE heel raise w/ UE support 2x12 BIL heel raise bounce 2x10 (low amplitude, cues to limit heel contact, no forefoot clearance) HEP update + education  Neuromuscular re-ed: RLE SLS w/ reactive ball catch/throw 2 kg 2x10 gradually increasing outside BOS Pre fall reactive weight shifting PT manually assisted 2x10 (forward>lateral vector)  Therapeutic Activity (use of mat, gait belt, and hands on PT assist for safety): Significant time w/ education/discussion and visual demonstration of fall components, strategies to maximize disperse force more efficiently, protection of head and surgical limb Half kneel>splay fall practice; ~15 total; time spent breaking down components of UE support, head turn, protecting  surgical limb. Starting slow velocity PT  assisted, gradually increasing velocity and weaning PT assist    [facility] Adult PT Treatment:                                                DATE: 02/25/24 Therapeutic Exercise: Recumbent bike R3 Staggered wall sit 2x76min RLE back  Up on 2 down w/ RLE bias heel raise 2x8   HEP update+ education  Neuromuscular re-ed: 8 inch lateral step up/down w/ UE support cues for hip mechanics 2x12 5#KB SL RDL w/ UE support 2x10 cues for pelvic and trunk positioning,knee positioning Inc time w/ education/discussion to ensure appropriate positioning, understanding of relevant anatomy/physiology    [facility] Adult PT Treatment:             14 weeks s/p sx                           DATE: 02/18/24 Therapeutic Activity: Recumbent bike during subjective  Wall sit x28min Wall slides x10 Banded side steps and monster walks RTB at ankles Standing heel/toe raises off step x15 iblat, 2x5  LE w/ min UE support  Neuromuscular re-ed: Fwd step ups/backs R LE Fwd step downs/ups R LE, light hand assist High knee step c opposite leg PF Fwd/Bwd and laterally 2x10, small jumps  PATIENT EDUCATION:  Education details: rationale for interventions, HEP  Person educated: Patient Education method: Explanation, Demonstration, Tactile cues, Verbal cues Education comprehension: verbalized understanding, returned demonstration, verbal cues required, tactile cues required, and needs further education     HOME EXERCISE PROGRAM: Access Code: [email or web address] URL: [email or web address] Date: 03/02/2024 Prepared by: Mayme Spearman  Program Notes - with heel raise, please incorporate soft "bounce" without heel contact, as done in clinic  Exercises - Long Sitting Calf Stretch with Strap  - 1-2 x daily - 7 x weekly - 1 sets - 3-5 reps - 30 hold - Towel Scrunches  - 1 x daily - 7 x weekly - 1 sets - 10 reps - Toe Yoga - Alternating Great Toe and Lesser Toe Extension  - 1 x daily  - 7 x weekly - 1 sets - 10 reps - Gastroc Stretch on Wall  - 1 x daily - 7 x weekly - 1 sets - 2-3 reps - 30 hold - Soleus Stretch on Wall  - 1 x daily - 7 x weekly - 1 sets - 2-3 reps - 30 hold - Standing Tandem Balance with Counter Support  - 1 x daily - 7 x weekly - 1 sets - 4 reps - 30 hold - Standing Heel Raise with Support  - 1 x daily - 7 x weekly - 2 sets - 8-10 reps - Single Leg Balance with Clock Reach  - 1 x daily - 7 x weekly - 3 sets - 5 reps - Side Step Down with Counter Support  - 1 x daily - 3-4 x weekly - 2 sets - 5 reps  ASSESSMENT:  CLINICAL IMPRESSION: 03/02/2024 Pt arrives w/o resting pain, reports surgical follow up went well. Per communication w/ surgeon, they have been cleared to progress activity as tolerated although advising slow progression with hopping/jogging. We continue to progress reactive SLS postural stability which they do well with. Also incorporating low load/amplitude plyometrics - some initial difficulty coordinating as expected but improves with repetition. Pt also  voices apprehension about upcoming field work and fall risk - significant time spent with this as described above. Pt tolerates this quite well, no adverse events or increases in pain. They voice improved comfort/confidence after practice. Recommend continuing along current POC in order to address relevant deficits and improve functional tolerance. Pt departs today's session in no acute distress, all voiced questions/concerns addressed appropriately from PT perspective.      Per eval - Patient is a 23 y.o. female who was seen today for physical therapy evaluation and treatment for s/p right ankle fracture.  11/12/23: OPEN TREATMENT RIGHT PILON ANKLE FRACTURE WITH FIBULA. Pt presents 8 weeks s/p Sx. The r ankle is swollen and moderately painful with decreased ROM. A HEP was developed. Pt will benefit from skilled PT 2w8 to address impairments to optimize R ankle function with less pain.   OBJECTIVE  IMPAIRMENTS: decreased activity tolerance, difficulty walking, decreased ROM, decreased strength, and pain.   ACTIVITY LIMITATIONS: bending, standing, squatting, and locomotion level  PARTICIPATION LIMITATIONS: meal prep, cleaning, shopping, community activity, and occupation  PERSONAL FACTORS: Time since onset of injury/illness/exacerbation are also affecting patient's functional outcome.   REHAB POTENTIAL: Good  CLINICAL DECISION MAKING: Evolving/moderate complexity  EVALUATION COMPLEXITY: Moderate   GOALS:   SHORT TERM GOALS: Target date: 01/29/24 Pt will be Ind in an initial HEP  Baseline:started Goal status: MET  2.  Increase R ankle AROM for DF to neutral PF by 10d  Baseline: see flow sheets Goal status: MET  LONG TERM GOALS: Target date: 03/18/24  Pt will be Ind in a final HEP to maintain achieved LOF  Baseline:  Goal status: INITIAL  2.  Increase R ankle AROM to with 85% of the L for proper function of the R ankle with ambulation Baseline: see flow sheets Goal status: INITIAL  3.  Pt will demonstrate R single leg balance within 85% of the L for proper R ankle /LE function Baseline:  Goal status: INITIAL  4. Improve 5xSTS by MCID of 5" and by MCID of 15ft as indication of improved functional mobility  Baseline: To assess when pt is walking with gait pattern exhibiting min gait deficits 02/16/24: 7.8" WNLs, 663 WNLs Goal status: MET- both are within age norms  5.  Pt  will demonstrate a normalized gait pattern for a distance of 1000' for community mobility Baseline:  Goal status: INITIAL  6.  Pt will be able to ascend/descend steps in a reciprocating pattern c 1 HR assist or less for improved function of the R ankle Baseline: Step to pattern Goal status: INITIAL  7. Pt's LEFS score will improved by at least the MCID to 46% as indication of improved function Baseline:: 33%  Goal Status: INITIAL   PLAN:  PT FREQUENCY: 2x/week  PT DURATION: 8  weeks  PLANNED INTERVENTIONS: 97164- PT Re-evaluation, 97110-Therapeutic exercises, 97530- Therapeutic activity, 97112- Neuromuscular re-education, 97535- Self Care, 09811- Manual therapy, [phone]- Gait training, [phone]- Electrical stimulation (unattended), Patient/Family education, Balance training, Stair training, Taping, Dry Needling, Joint mobilization, Scar mobilization, Cryotherapy, and Moist heat  PLAN FOR NEXT SESSION: SLS and reactive balance. Low load/amplitude plyometrics within pt tolerance, recommend conservative progression. Fall risk reduction/practice as indicated.   Lovett Ruck PT, DPT 03/02/2024 12:48 PM

## 2024-03-04 ENCOUNTER — Encounter: Admitting: Physical Therapy

## 2024-03-31 NOTE — Therapy (Signed)
 OUTPATIENT PHYSICAL THERAPY RE-EVALUATION + RECERTIFICATION   Patient Name: Laurie [name] MRN: [id number] DOB:Dec 31, 2000, 23 y.o., adult Today's Date: 03/31/2024  END OF SESSION:         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/28/2018   Prediabetes 02/08/2016   Vitamin D  deficiency 12/21/2014   Past Surgical History:  Procedure Laterality Date   COLONOSCOPY WITH ESOPHAGOGASTRODUODENOSCOPY (EGD)  10/27/2022   ORIF ANKLE FRACTURE Right 11/12/2023   Procedure: OPEN TREATMENT RIGHT PILON ANKLE FRACTURE WITH FIBULA;  Surgeon: Donnamarie Gables, MD;  Location: [facility];  Service: Orthopedics;  Laterality: Right;   Patient Active Problem List   Diagnosis Date Noted   Endocrine disorder, unspecified 11/21/2019   Menstrual cramps 02/08/2016   Frequent headaches 12/21/2014    PCP: Abraham Abo, MD  REFERRING PROVIDER: Donnamarie Gables, MD  REFERRING DIAG: s/p right ankle fracture   THERAPY DIAG:  No diagnosis found.  Rationale for Evaluation and Treatment: Rehabilitation  ONSET DATE: 11/02/23 Fx ankle rock climbing with fall from 15 ft. Sx 11/12/23 8 weeks s/p  SUBJECTIVE:   SUBJECTIVE STATEMENT: 03/31/2024: ***  *** states surgeon follow up went well, says they may consider injections going forward. Cleared for activity as tolerated. No pain at present. Wants to work on fall practice    PERTINENT HISTORY: See PMH  PAIN:  Are you having pain? Yes: NPRS scale: 0/10 Pain location: R ankle Pain description: ache Aggravating factors: prolonged time on feet Relieving factors: Cold pack  PRECAUTIONS: Other: WBAT  RED FLAGS: None   WEIGHT BEARING RESTRICTIONS: WB in walking boot Call Dr. Verneita Goldmann office for status of Wt bearing exs in standing out of the walking boot while at PT  Per note from evaluating PT: 4/4 call MD who states no CK[facility] WB out of boot yet. Only open chain out of boot at this time Per pt report after MD visit  01/26/24 cleared to [facility] outside boot Per surgeon communication 03/02/24 via secure chat: okay to progress activity as tolerated, they recommend slow progression with jumping/hopping  FALLS:  Has patient fallen in last 6 months? Yes. 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 Hulda Mage  OBJECTIVE:  Note: Objective measures were completed at Evaluation unless otherwise noted.  DIAGNOSTIC FINDINGS:  DG R Ankle 11/12/23 IMPRESSION: Intraoperative fluoroscopic guidance for ORIF of the distal tibia and fibula.  PATIENT SURVEYS:  LEFS 26/80=33% ability, 67% disability  COGNITION: Overall cognitive status: Within functional limits for tasks assessed     SENSATION: WFL  EDEMA:  Significant swelling present  MUSCLE LENGTH: Hamstrings: Right WNLs deg; Left WNLs deg Andy Bannister test: Right NT deg; Left NT deg  POSTURE: No Significant postural limitations  PALPATION: TTP of the R peri-anlke  LOWER EXTREMITY ROM:  Bilat hips and knees WNLs Active ROM Right eval Left eval RT 01/20/24 RT 01/28/24 Rt 02/04/24 Right 03/31/24  Hip flexion        Hip extension        Hip abduction        Hip adduction        Hip internal rotation        Hip external rotation  Knee flexion        Knee extension        Ankle dorsiflexion A 10 lacking, P 0 A 13 A 3 lacking 0 A 4 A ***  Ankle plantarflexion A 20 A 40 32 A 32 A    Ankle inversion A 9 A 32 A 18     Ankle eversion A 12 A 21 A 14      (Blank rows = not tested)  LOWER EXTREMITY MMT:  Bilat hip and kness 5/5. R ankle NT MMT Right eval Left eval  Hip flexion    Hip extension    Hip abduction    Hip adduction    Hip internal rotation     Hip external rotation    Knee flexion    Knee extension    Ankle dorsiflexion    Ankle plantarflexion    Ankle inversion    Ankle eversion     (Blank rows = not tested)  LOWER EXTREMITY SPECIAL TESTS:  NT  FUNCTIONAL TESTS: TBA when able to walk with min gait deficit  5 times sit to stand: TBA 2 minute walk test: TBA  GAIT: Distance walked: 200 Assistive device utilized: walking boot Level of assistance: Modified independence Comments: WNLs c walking boot                                                                                                          TREATMENT DATE:   [facility] Adult PT Treatment:                                                DATE: 04/01/24 Therapeutic Exercise: *** Manual Therapy: *** Neuromuscular re-ed: *** Therapeutic Activity: *** Modalities: *** Self Care: ***    [facility] Adult PT Treatment:                                                DATE: 03/02/24 Therapeutic Exercise: Recumbent bike warmup Kickstand RLE heel raise w/ UE support 2x12 BIL heel raise bounce 2x10 (low amplitude, cues to limit heel contact, no forefoot clearance) HEP update + education  Neuromuscular re-ed: RLE SLS w/ reactive ball catch/throw 2 kg 2x10 gradually increasing outside BOS Pre fall reactive weight shifting PT manually assisted 2x10 (forward>lateral vector)  Therapeutic Activity (use of mat, gait belt, and hands on PT assist for safety): Significant time w/ education/discussion and visual demonstration of fall components, strategies to maximize disperse force more efficiently, protection of head and surgical limb Half kneel>splay fall practice; ~15 total; time spent breaking down components of UE support, head turn, protecting surgical limb. Starting slow velocity PT assisted, gradually increasing velocity and weaning PT assist    [facility] Adult PT Treatment:  DATE: 02/25/24 Therapeutic Exercise: Recumbent bike  R3 Staggered wall sit 2x19min RLE back  Up on 2 down w/ RLE bias heel raise 2x8   HEP update+ education  Neuromuscular re-ed: 8 inch lateral step up/down w/ UE support cues for hip mechanics 2x12 5#KB SL RDL w/ UE support 2x10 cues for pelvic and trunk positioning,knee positioning Inc time w/ education/discussion to ensure appropriate positioning, understanding of relevant anatomy/physiology    [facility] Adult PT Treatment:             14 weeks s/p sx                           DATE: 02/18/24 Therapeutic Activity: Recumbent bike during subjective  Wall sit x19min Wall slides x10 Banded side steps and monster walks RTB at ankles Standing heel/toe raises off step x15 iblat, 2x5  LE w/ min UE support  Neuromuscular re-ed: Fwd step ups/backs R LE Fwd step downs/ups R LE, light hand assist High knee step c opposite leg PF Fwd/Bwd and laterally 2x10, small jumps  PATIENT EDUCATION:  Education details: rationale for interventions, HEP  Person educated: Patient Education method: Explanation, Demonstration, Tactile cues, Verbal cues Education comprehension: verbalized understanding, returned demonstration, verbal cues required, tactile cues required, and needs further education     HOME EXERCISE PROGRAM: Access Code: [email or web address] URL: [email or web address] Date: 03/02/2024 Prepared by: Mayme Spearman  Program Notes - with heel raise, please incorporate soft bounce without heel contact, as done in clinic  Exercises - Long Sitting Calf Stretch with Strap  - 1-2 x daily - 7 x weekly - 1 sets - 3-5 reps - 30 hold - Towel Scrunches  - 1 x daily - 7 x weekly - 1 sets - 10 reps - Toe Yoga - Alternating Great Toe and Lesser Toe Extension  - 1 x daily - 7 x weekly - 1 sets - 10 reps - Gastroc Stretch on Wall  - 1 x daily - 7 x weekly - 1 sets - 2-3 reps - 30 hold - Soleus Stretch on Wall  - 1 x daily - 7 x weekly - 1 sets - 2-3 reps - 30 hold - Standing Tandem Balance with  Counter Support  - 1 x daily - 7 x weekly - 1 sets - 4 reps - 30 hold - Standing Heel Raise with Support  - 1 x daily - 7 x weekly - 2 sets - 8-10 reps - Single Leg Balance with Clock Reach  - 1 x daily - 7 x weekly - 3 sets - 5 reps - Side Step Down with Counter Support  - 1 x daily - 3-4 x weekly - 2 sets - 5 reps  ASSESSMENT:  CLINICAL IMPRESSION: 03/31/2024: ***  ***  Pt arrives w/o resting pain, reports surgical follow up went well. Per communication w/ surgeon, they have been cleared to progress activity as tolerated although advising slow progression with hopping/jogging. We continue to progress reactive SLS postural stability which they do well with. Also incorporating low load/amplitude plyometrics - some initial difficulty coordinating as expected but improves with repetition. Pt also voices apprehension about upcoming field work and fall risk - significant time spent with this as described above. Pt tolerates this quite well, no adverse events or increases in pain. They voice improved comfort/confidence after practice. Recommend continuing along current POC in order to address relevant deficits and improve functional tolerance. Pt departs today's session in no  acute distress, all voiced questions/concerns addressed appropriately from PT perspective.      Per eval - Patient is a 23 y.o. female who was seen today for physical therapy evaluation and treatment for s/p right ankle fracture.  11/12/23: OPEN TREATMENT RIGHT PILON ANKLE FRACTURE WITH FIBULA. Pt presents 8 weeks s/p Sx. The r ankle is swollen and moderately painful with decreased ROM. A HEP was developed. Pt will benefit from skilled PT 2w8 to address impairments to optimize R ankle function with less pain.   OBJECTIVE IMPAIRMENTS: decreased activity tolerance, difficulty walking, decreased ROM, decreased strength, and pain.   ACTIVITY LIMITATIONS: bending, standing, squatting, and locomotion level  PARTICIPATION LIMITATIONS:  meal prep, cleaning, shopping, community activity, and occupation  PERSONAL FACTORS: Time since onset of injury/illness/exacerbation are also affecting patient's functional outcome.   REHAB POTENTIAL: Good  CLINICAL DECISION MAKING: Evolving/moderate complexity  EVALUATION COMPLEXITY: Moderate   GOALS:   SHORT TERM GOALS: Target date: 01/29/24 Pt will be Ind in an initial HEP  Baseline:started Goal status: MET  2.  Increase R ankle AROM for DF to neutral PF by 10d  Baseline: see flow sheets Goal status: MET  LONG TERM GOALS: Target date: 03/18/24 *** (updated 04/01/24)  Pt will be Ind in a final HEP to maintain achieved LOF  Baseline:  Goal status: INITIAL  2.  Increase R ankle AROM to with 85% of the L for proper function of the R ankle with ambulation Baseline: see flow sheets Goal status: INITIAL  3.  Pt will demonstrate R single leg balance within 85% of the L for proper R ankle /LE function Baseline:  Goal status: INITIAL  4. Improve 5xSTS by MCID of 5 and by MCID of 83ft as indication of improved functional mobility  Baseline: To assess when pt is walking with gait pattern exhibiting min gait deficits 02/16/24: 7.8 WNLs, 663 WNLs Goal status: MET- both are within age norms  5.  Pt  will demonstrate a normalized gait pattern for a distance of 1000' for community mobility Baseline:  Goal status: INITIAL  6.  Pt will be able to ascend/descend steps in a reciprocating pattern c 1 HR assist or less for improved function of the R ankle Baseline: Step to pattern Goal status: INITIAL  7. Pt's LEFS score will improved by at least the MCID to 46% as indication of improved function Baseline:: 33%  Goal Status: INITIAL   PLAN:  PT FREQUENCY: 2x/week  PT DURATION: 8 weeks  PLANNED INTERVENTIONS: 97164- PT Re-evaluation, 97110-Therapeutic exercises, 97530- Therapeutic activity, 97112- Neuromuscular re-education, 97535- Self Care, 81191- Manual therapy, [phone]-  Gait training, [phone]- Electrical stimulation (unattended), Patient/Family education, Balance training, Stair training, Taping, Dry Needling, Joint mobilization, Scar mobilization, Cryotherapy, and Moist heat  PLAN FOR NEXT SESSION: SLS and reactive balance. Low load/amplitude plyometrics within pt tolerance, recommend conservative progression. Fall risk reduction/practice as indicated.   Lovett Ruck PT, DPT 03/31/2024 1:28 PM

## 2024-04-01 ENCOUNTER — Encounter: Payer: Self-pay | Admitting: Physical Therapy

## 2024-04-01 ENCOUNTER — Ambulatory Visit: Attending: Orthopaedic Surgery | Admitting: Physical Therapy

## 2024-04-01 DIAGNOSIS — R262 Difficulty in walking, not elsewhere classified: Secondary | ICD-10-CM | POA: Diagnosis present

## 2024-04-01 DIAGNOSIS — M6281 Muscle weakness (generalized): Secondary | ICD-10-CM | POA: Diagnosis present

## 2024-04-01 DIAGNOSIS — M25571 Pain in right ankle and joints of right foot: Secondary | ICD-10-CM | POA: Diagnosis present

## 2024-04-09 ENCOUNTER — Ambulatory Visit: Admitting: Physical Therapy

## 2024-04-13 ENCOUNTER — Other Ambulatory Visit (HOSPITAL_COMMUNITY)
Admission: RE | Admit: 2024-04-13 | Discharge: 2024-04-13 | Disposition: A | Discharge status: Home / Self Care | Source: Ambulatory Visit | Attending: Obstetrics & Gynecology | Admitting: Obstetrics & Gynecology

## 2024-04-13 ENCOUNTER — Ambulatory Visit: Admitting: Obstetrics & Gynecology

## 2024-04-13 VITALS — BP 110/72 | HR 96 | Wt 162.0 lb

## 2024-04-13 DIAGNOSIS — N870 Mild cervical dysplasia: Secondary | ICD-10-CM | POA: Diagnosis not present

## 2024-04-13 DIAGNOSIS — R87612 Low grade squamous intraepithelial lesion on cytologic smear of cervix (LGSIL): Secondary | ICD-10-CM | POA: Diagnosis not present

## 2024-04-13 DIAGNOSIS — R87810 Cervical high risk human papillomavirus (HPV) DNA test positive: Secondary | ICD-10-CM | POA: Diagnosis not present

## 2024-04-13 DIAGNOSIS — N888 Other specified noninflammatory disorders of cervix uteri: Secondary | ICD-10-CM | POA: Diagnosis not present

## 2024-04-13 DIAGNOSIS — Z3202 Encounter for pregnancy test, result negative: Secondary | ICD-10-CM | POA: Diagnosis not present

## 2024-04-13 LAB — POCT URINE PREGNANCY: Preg Test, Ur: NEGATIVE

## 2024-04-13 NOTE — Therapy (Signed)
 OUTPATIENT PHYSICAL THERAPY RE-ASSESSMENT + RECERTIFICATION   Patient Name: Laurie [name] MRN: [id number] DOB:[birth year], 23 y.o., adult Today's Date: 04/15/2024  END OF SESSION:  PT End of Session - 04/14/24 1641     Visit Number 14    Number of Visits 21    Date for PT Re-Evaluation 05/27/24    Authorization Type [place] MEDICAID [employer]    Authorization - Visit Number 14    Authorization - Number of Visits 27    PT Start Time 1550    PT Stop Time 1632    PT Time Calculation (min) 42 min    Activity Tolerance Patient tolerated treatment well    Behavior During Therapy WFL for tasks assessed/performed           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/28/2018   Prediabetes 02/08/2016   Vitamin D  deficiency 12/21/2014   Past Surgical History:  Procedure Laterality Date   COLONOSCOPY WITH ESOPHAGOGASTRODUODENOSCOPY (EGD)  10/27/2022   ORIF ANKLE FRACTURE Right 11/12/2023   Procedure: OPEN TREATMENT RIGHT PILON ANKLE FRACTURE WITH FIBULA;  Surgeon: Elsa Lonni SAUNDERS, MD;  Location: [facility] OR;  Service: Orthopedics;  Laterality: Right;   Patient Active Problem List   Diagnosis Date Noted   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 Sx 11/12/23 8 weeks s/p  SUBJECTIVE:   SUBJECTIVE STATEMENT: 04/15/2024 Pt reports her standing tolerance is about 4-5 hours. Overall, pt is pleased with her progress.   PERTINENT HISTORY: See PMH  PAIN:   Are you having pain? 0/10 Worst in past week: 2-3/10 at end of active days  Per eval: Pain location: R ankle Pain description: ache Aggravating factors: prolonged time on feet Relieving factors: Cold pack  PRECAUTIONS: Other: WBAT  RED FLAGS: None   WEIGHT BEARING RESTRICTIONS: WB in walking boot Call Dr. Isiah office for status of Wt bearing exs in standing out of the walking boot while at PT  Per note from evaluating PT: 4/4 call MD who states no [facility] WB out of boot yet. Only open chain out of boot at this time Per pt report after MD visit 01/26/24 cleared to [facility] outside boot Per surgeon communication 03/02/24 via secure chat: okay to progress activity as tolerated, they recommend slow progression with jumping/hopping  FALLS:  Has patient fallen in last 6 months? Yes. Number of falls 1  With injury  LIVING ENVIRONMENT: Lives with: Roommates Lives in: House/apartment Stairs: Yes: External: 15 steps; can reach both Has following equipment at home: None  OCCUPATION: Chartered certified accountant  PLOF: Independent  PATIENT GOALS: Return to function  NEXT MD VISIT: TBD  OBJECTIVE:  Note: Objective measures were completed at Evaluation unless otherwise noted.  DIAGNOSTIC FINDINGS:  DG R Ankle 11/12/23 IMPRESSION: Intraoperative fluoroscopic guidance for ORIF of the distal tibia and fibula.  PATIENT SURVEYS:  LEFS 26/80=33% ability, 67% disability  LEFS 04/01/24 68/80  COGNITION: Overall cognitive status: Within functional limits for tasks assessed     SENSATION: [facility]  EDEMA:  Significant swelling present 04/01/24: no significant edema   MUSCLE LENGTH: Hamstrings: Right WNLs deg; Left WNLs deg Debby test: Right NT deg; Left NT deg  POSTURE: No Significant postural limitations  PALPATION: TTP of the R peri-anlke  04/01/24: mild TTP R toe extensors but otherwise unremarkable exam, scar WNL  LOWER EXTREMITY ROM:  Bilat hips and knees WNLs Active ROM Right eval  Left eval RT 01/20/24 RT 01/28/24 Rt 02/04/24 Right 03/31/24  Hip flexion        Hip extension        Hip abduction        Hip adduction        Hip internal rotation        Hip external rotation        Knee flexion        Knee extension        Ankle dorsiflexion A 10 lacking, P 0 A 13 A 3 lacking 0 A 4 A 6 deg  Ankle plantarflexion A 20 A 40 32 A 32 A    Ankle inversion A 9 A 32 A 18   32  Ankle eversion A 12 A 21 A 14   20   (Blank rows = not tested)  LOWER EXTREMITY MMT:  Bilat hip and kness 5/5. R ankle NT MMT Right eval Left eval R/L 04/01/24  Hip flexion     Hip extension     Hip abduction     Hip adduction     Hip internal rotation     Hip external rotation     Knee flexion     Knee extension     Ankle dorsiflexion   5/5  Ankle plantarflexion     Ankle inversion   5/5  Ankle eversion   5/5   (Blank rows = not tested)  LOWER EXTREMITY SPECIAL TESTS:  NT  FUNCTIONAL TESTS: TBA when able to walk with min gait deficit  5 times sit to stand: TBA 2 minute walk test: TBA   03/31/24: - double leg heel raise: 30 reps RPE min fatigue RPE 4/10  - single leg heel raise: L 30 reps RPE 2/10 ; R 20 reps, 2-3/10 NPS 4-5 RPE  - double leg low amplitude vertical jump: 10 reps, RPE 4-5/0 painless  GAIT: Distance walked: 200 Assistive device utilized: walking boot Level of assistance: Modified independence Comments: WNLs c walking boot                                                                                                          TREATMENT DATE:  [facility] Adult PT Treatment:                                                DATE: 04/14/24 Neuromuscular re-ed: NuStep L5 LE SL PF with opp high knee steps Bosu ball balance s challenge, then compass point reaches c L  LE Balance on green theramat c tramp ball toss, fwd and laterally  SL STS 2x10 RLE only cues for eccentric control and weight shifting Therapeutic Activity: Nustep 5 min 5L LE Fwd/bwd and lateral 2 footed  jumping 2x10 Single R leg small range fwd/bwd jumping x10, pt reports min pain with this activity SL Leg press 60# 2x10, compared with L the pt found the R much weaker   [facility] Adult PT Treatment:                                                DATE: 04/01/24 Neuromuscular re-ed: SL STS 2x10 RLE only cues for eccentric control and weight shifting SL wall sit 2x20sec RLE (fatiguing but nonpainful)  RLE single leg stance + soccer roll out 2x15 sec   Therapeutic Activity: MSK assessment + education Functional testing as above (heel raises single and double, double leg vert jump, etc) + education Education/discussion re: progress with PT, symptom behavior as it affects activity tolerance, PT goals/POC   HEP update + education   [facility] Adult PT Treatment:                                                DATE: 03/02/24 Therapeutic Exercise: Recumbent bike warmup Kickstand RLE heel raise w/ UE support 2x12 BIL heel raise bounce 2x10 (low amplitude, cues to limit heel contact, no forefoot clearance) HEP update + education  Neuromuscular re-ed: RLE SLS w/ reactive ball catch/throw 2 kg 2x10 gradually increasing outside BOS Pre fall reactive weight shifting PT manually assisted 2x10 (forward>lateral vector)  Therapeutic Activity (use of mat, gait belt, and hands on PT assist for safety): Significant time w/ education/discussion and visual demonstration of fall components, strategies to maximize disperse force more efficiently, protection of head and surgical limb Half kneel>splay fall practice; ~15 total; time spent breaking down components of UE support, head turn, protecting surgical limb. Starting slow velocity PT assisted, gradually increasing velocity and weaning PT assist  PATIENT EDUCATION:  Education details: rationale for interventions, HEP  Person educated: Patient Education method: Explanation, Demonstration, Tactile cues, Verbal cues Education comprehension: verbalized  understanding, returned demonstration, verbal cues required, tactile cues required, and needs further education     HOME EXERCISE PROGRAM: Access Code: [email or web address] URL: [email or web address] Date: 04/01/2024 Prepared by: Alm Jenny  Program Notes - with heel raise, please incorporate soft bounce without heel contact, as done in clinic  Exercises - Standing Heel Raise with Support  - 1 x daily - 7 x weekly - 2 sets - 8-10 reps - Single Leg Balance with Clock Reach  - 1 x daily - 7 x weekly - 3 sets - 5 reps - Side Step Down with Counter Support  - 3-4 x weekly - 2 sets - 5 reps - Single Leg Heel Raise with Unilateral Counter Support  - 3-4 x weekly - 2-3 sets - 8-12 reps - Single Leg Sit to Stand with Arms Extended  - 3-4 x weekly - 2-3 sets - 8-10 reps  ASSESSMENT:  CLINICAL IMPRESSION: 04/14/24: PT was completed for ROM, strengthening, dynamic stability, and plyometrics. Pt and pt both note deficits of the R ankle with decreased DF ROM c squatting, decreased strength tolerance c  SL STS and leg press and min pain with SL jumping. Overall, pt's function of the R ankle is much improved. Pt will benefit from skilled PT to progress towards higher level activities such as running, jumping, and climbing to minimize risk of future injury.  Per eval - Patient is a 23 y.o. female who was seen today for physical therapy evaluation and treatment for s/p right ankle fracture.  11/12/23: OPEN TREATMENT RIGHT PILON ANKLE FRACTURE WITH FIBULA. Pt presents 8 weeks s/p Sx. The r ankle is swollen and moderately painful with decreased ROM. A HEP was developed. Pt will benefit from skilled PT 2w8 to address impairments to optimize R ankle function with less pain.   OBJECTIVE IMPAIRMENTS: decreased activity tolerance, difficulty walking, decreased ROM, decreased strength, and pain.   ACTIVITY LIMITATIONS: bending, standing, squatting, and locomotion level  PARTICIPATION LIMITATIONS: meal prep,  cleaning, shopping, community activity, and occupation  PERSONAL FACTORS: Time since onset of injury/illness/exacerbation are also affecting patient's functional outcome.   REHAB POTENTIAL: Good  CLINICAL DECISION MAKING: Evolving/moderate complexity  EVALUATION COMPLEXITY: Moderate   GOALS:   SHORT TERM GOALS: Target date: 01/29/24 Pt will be Ind in an initial HEP  Baseline:started Goal status: MET  2.  Increase R ankle AROM for DF to neutral PF by 10d  Baseline: see flow sheets Goal status: MET  LONG TERM GOALS: Target date: 05/27/2024  (updated 04/01/24)  Pt will be Ind in a final HEP to maintain achieved LOF  Baseline:  04/01/24: HEP ONGOING Goal status: ONGOING  2.  Increase R ankle AROM to with 85% of the L for proper function of the R ankle with ambulation Baseline: see flow sheets 04/01/24: AROM WNL Goal status: MET  3.  Pt will demonstrate R single leg balance within 85% of the L for proper R ankle /LE function Baseline:  04/01/24: achieved during treatment Goal status: MET  4. Improve 5xSTS by MCID of 5 and by MCID of 74ft as indication of improved functional mobility  Baseline: To assess when pt is walking with gait pattern exhibiting min gait deficits 02/16/24: 7.8 WNLs, 663 WNLs Goal status: MET- both are within age norms  5.  Pt  will demonstrate a normalized gait pattern for a distance of 1000' for community mobility Baseline:  04/01/24: pt endorses ability to walk 3-4 miles at a time Goal status: MET  6.  Pt will be able to ascend/descend steps in a reciprocating pattern c 1 HR assist or less for improved function of the R ankle Baseline: Step to pattern 04/01/24: not formally tested Goal status: ONGOING  7. Pt's LEFS score will improve to at least 75/80 for improved perception of function. Baseline:: 33%  04/01/24: 68/80, initial goal met Goal Status: NEW/UPDATED 04/01/24  8. Pt will be able to perform at least 30 single limb heel raises bilat  with comparable RPE in order to facilitate improved functional strengthening.   Baseline: see functional testing above  Goal status: INITIAL/NEW 04/01/24  9. Pt will be able to perform at least 30 low amplitude double limb vertical jumps without pain and RPE <4/10 in order to facilitate improved plyometric tolerance for return to PLOF.  Baseline: see functional testing above  Goal status: INITIAL/NEW 04/01/24   PLAN: (updated 04/01/24)  PT FREQUENCY: 1x/week  PT DURATION: 8 weeks  PLANNED INTERVENTIONS: 97164- PT Re-evaluation, 97110-Therapeutic exercises, 97530- Therapeutic activity, 97112- Neuromuscular re-education, 97535- Self Care, [id number]- Manual therapy, [phone]- Gait training, [phone]- Electrical stimulation (unattended), Patient/Family education,  Balance training, Stair training, Taping, Dry Needling, Joint mobilization, Scar mobilization, Cryotherapy, and Moist heat  PLAN FOR NEXT SESSION: SL strength, low amplitude plyos with conservative progression. Work on barefoot strengthening to assist with climbing. Progress within pt tolerance towards goals of running, jumping, climbing.   Kerriann Kamphuis MS, PT 04/17/24 3:51 PM

## 2024-04-13 NOTE — Progress Notes (Signed)
 Pt is in office for Colposcopy.  Pt had Nexplanon  recently placed, no cycle since.

## 2024-04-13 NOTE — Progress Notes (Signed)
 Patient ID: Laurie [name], adult   DOB: April 04, 2001, 23 y.o.   MRN: [id number]  Chief Complaint  Patient presents with   Colposcopy    HPI [name] is a 23 y.o. adult.  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 Previous colposcopy: no. Prior cervical treatment: no treatment.  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/28/2018   Prediabetes 02/08/2016   Vitamin D  deficiency 12/21/2014    Past Surgical History:  Procedure Laterality Date   COLONOSCOPY WITH ESOPHAGOGASTRODUODENOSCOPY (EGD)  10/27/2022   ORIF ANKLE FRACTURE Right 11/12/2023   Procedure: OPEN TREATMENT RIGHT PILON ANKLE FRACTURE WITH FIBULA;  Surgeon: Elsa Lonni SAUNDERS, MD;  Location: [facility] OR;  Service: Orthopedics;  Laterality: Right;    Family History  Problem Relation Age of Onset   Colon cancer Maternal Grandmother    Cancer Maternal Grandmother    Alcohol abuse Maternal Grandmother    Diabetes Maternal Grandfather    Heart disease Maternal Grandfather    Hyperlipidemia Maternal Grandfather    Hypertension Maternal Grandfather    Alcohol abuse Maternal Grandfather    Obesity Paternal Grandmother    Hyperlipidemia Paternal Grandmother    Hypertension Paternal Grandmother    Obesity Paternal Grandfather    Heart disease Paternal Grandfather        A fib related to history of rheumatic heart disease   Diabetes Paternal Grandfather    Asthma Neg Hx    Esophageal cancer Neg Hx    Stomach cancer Neg Hx    Rectal cancer Neg Hx     Social History Social History   Tobacco Use   Smoking status: Some Days    Types: Cigarettes   Smokeless tobacco: Never  Vaping Use   Vaping status: Former   Quit date: 10/01/2022   Substances: Nicotine  Substance Use Topics   Alcohol use: Yes    Comment: socially   Drug use: Never    No Known Allergies  Current  Outpatient Medications  Medication Sig Dispense Refill   Etonogestrel  (NEXPLANON  [place]) Inject into the skin.     acetaminophen  (TYLENOL ) 500 MG tablet Take 500 mg by mouth 2 (two) times daily as needed for moderate pain (pain score 4-6) or headache. (Patient not taking: Reported on 01/27/2024)     aspirin  (BAYER ASPIRIN ) 325 MG tablet Take 1 tablet (325 mg total) by mouth daily. (Patient not taking: Reported on 01/27/2024) 100 tablet 3   loratadine  (CLARITIN ) 10 MG tablet Take 1 tablet (10 mg total) by mouth daily. (Patient not taking: Reported on 02/16/2024) 30 tablet 11   valACYclovir  (VALTREX ) 500 MG tablet Take 1 tablet (500 mg total) by mouth 2 (two) times daily. (Patient not taking: Reported on 01/27/2024) 30 tablet 0   No current facility-administered medications for this visit.    Review of Systems Review of Systems  Respiratory: Negative.    Cardiovascular: Negative.   Gastrointestinal: Negative.   Genitourinary:  Negative for menstrual problem, pelvic pain and vaginal discharge.    Blood pressure 110/72, pulse 96, weight 162 lb (73.5 kg).  Physical Exam Physical Exam Vitals and nursing note reviewed. Exam conducted with a chaperone present.  Constitutional:      Appearance: Normal appearance.  Genitourinary:    General: Normal vulva.     Vagina: Normal.     Cervix: Normal.        Comments: Bx at 9  Neurological:     Mental Status: Laurie [name] is alert.     Coordination: Coordination normal.    Patient given informed consent, signed copy in the chart, time out was performed.  Placed in lithotomy position. Cervix viewed with speculum and colposcope after application of acetic acid.   Colposcopy adequate?  yes Acetowhite lesions?yes Punctation?no Mosaicism?  no Abnormal vasculature?  no Biopsies?yes ECC?yes  COMMENTS: Patient was given post procedure instructions.  Lynwood Solomons, MD Data Reviewed Pap result  Assessment    Procedure Details  The risks and benefits of  the procedure and Written informed consent obtained.  Speculum placed in vagina and excellent visualization of cervix achieved, cervix swabbed x 3 with acetic acid solution.  Specimens: ECC and Bx from 9  Complications: none.     Plan    Specimens labelled and sent to Pathology. Suspect LGSIL      Lynwood Solomons 04/13/2024, 9:07 AM

## 2024-04-14 ENCOUNTER — Ambulatory Visit: Attending: Orthopaedic Surgery

## 2024-04-14 DIAGNOSIS — M25571 Pain in right ankle and joints of right foot: Secondary | ICD-10-CM | POA: Diagnosis present

## 2024-04-14 DIAGNOSIS — M6281 Muscle weakness (generalized): Secondary | ICD-10-CM | POA: Diagnosis present

## 2024-04-14 DIAGNOSIS — R262 Difficulty in walking, not elsewhere classified: Secondary | ICD-10-CM | POA: Diagnosis present

## 2024-04-18 LAB — SURGICAL PATHOLOGY

## 2024-04-18 NOTE — Therapy (Signed)
 OUTPATIENT PHYSICAL THERAPY RE-ASSESSMENT + RECERTIFICATION   Patient Name: Laurie [name] MRN: [id number] DOB:2001/08/02, 23 y.o., adult Today's Date: 04/19/2024  END OF SESSION:  PT End of Session - 04/19/24 0848     Visit Number 15    Number of Visits 21    Date for PT Re-Evaluation 05/27/24    Authorization Type [place] MEDICAID [employer]    Authorization - Visit Number 15    Authorization - Number of Visits 27    PT Start Time 0848    PT Stop Time 0930    PT Time Calculation (min) 42 min    Activity Tolerance Patient tolerated treatment well    Behavior During Therapy WFL for tasks assessed/performed            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/28/2018   Prediabetes 02/08/2016   Vitamin D  deficiency 12/21/2014   Past Surgical History:  Procedure Laterality Date   COLONOSCOPY WITH ESOPHAGOGASTRODUODENOSCOPY (EGD)  10/27/2022   ORIF ANKLE FRACTURE Right 11/12/2023   Procedure: OPEN TREATMENT RIGHT PILON ANKLE FRACTURE WITH FIBULA;  Surgeon: Elsa Lonni SAUNDERS, MD;  Location: [facility] OR;  Service: Orthopedics;  Laterality: Right;   Patient Active Problem List   Diagnosis Date Noted   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 Sx 11/12/23 8 weeks s/p  SUBJECTIVE:   SUBJECTIVE STATEMENT: 04/19/2024 Pt reports noticing occasionally popping of her R ankle. She does not notice it with stressful activities, but with random  movements.   PERTINENT HISTORY: See PMH  PAIN:  Are you having pain? 0/10 Worst in past week: 2-3/10 at end of active days  Per eval: Pain location: R ankle Pain description: ache Aggravating factors: prolonged time on feet Relieving factors: Cold pack  PRECAUTIONS: Other: WBAT  RED FLAGS: None   WEIGHT BEARING RESTRICTIONS: WB in walking boot Call Dr. Isiah office for status of Wt bearing exs in standing out of the walking boot while at PT  Per note from evaluating PT: 4/4 call MD who states no CKC WB out of boot yet. Only open chain out of boot at this time Per pt report after MD visit 01/26/24 cleared to [facility] outside boot Per surgeon communication 03/02/24 via secure chat: okay to progress activity as tolerated, they recommend slow progression with jumping/hopping  FALLS:  Has patient fallen in last 6 months? Yes. Number of falls 1  With injury  LIVING ENVIRONMENT: Lives with: Roommates Lives in: House/apartment Stairs: Yes: External: 15 steps; can reach both Has following equipment at home: None  OCCUPATION: Chartered certified accountant  PLOF: Independent  PATIENT GOALS: Return to function  NEXT MD VISIT: TBD  OBJECTIVE:  Note: Objective measures were completed at Evaluation unless otherwise noted.  DIAGNOSTIC FINDINGS:  DG R Ankle 11/12/23 IMPRESSION: Intraoperative fluoroscopic guidance for ORIF of the distal tibia and fibula.  PATIENT SURVEYS:  LEFS 26/80=33% ability, 67% disability  LEFS 04/01/24 68/80  COGNITION: Overall cognitive status: Within functional limits for tasks assessed  SENSATION: WFL  EDEMA:  Significant swelling present 04/01/24: no significant edema   MUSCLE LENGTH: Hamstrings: Right WNLs deg; Left WNLs deg Debby test: Right NT deg; Left NT deg  POSTURE: No Significant postural limitations  PALPATION: TTP of the R peri-anlke  04/01/24: mild TTP R toe extensors but otherwise unremarkable exam, scar WNL  LOWER EXTREMITY  ROM:  Bilat hips and knees WNLs Active ROM Right eval Left eval RT 01/20/24 RT 01/28/24 Rt 02/04/24 Right 03/31/24  Hip flexion        Hip extension        Hip abduction        Hip adduction        Hip internal rotation        Hip external rotation        Knee flexion        Knee extension        Ankle dorsiflexion A 10 lacking, P 0 A 13 A 3 lacking 0 A 4 A 6 deg  Ankle plantarflexion A 20 A 40 32 A 32 A    Ankle inversion A 9 A 32 A 18   32  Ankle eversion A 12 A 21 A 14   20   (Blank rows = not tested)  LOWER EXTREMITY MMT:  Bilat hip and kness 5/5. R ankle NT MMT Right eval Left eval R/L 04/01/24  Hip flexion     Hip extension     Hip abduction     Hip adduction     Hip internal rotation     Hip external rotation     Knee flexion     Knee extension     Ankle dorsiflexion   5/5  Ankle plantarflexion     Ankle inversion   5/5  Ankle eversion   5/5   (Blank rows = not tested)  LOWER EXTREMITY SPECIAL TESTS:  NT  FUNCTIONAL TESTS: TBA when able to walk with min gait deficit  5 times sit to stand: TBA 2 minute walk test: TBA   03/31/24: - double leg heel raise: 30 reps RPE min fatigue RPE 4/10  - single leg heel raise: L 30 reps RPE 2/10 ; R 20 reps, 2-3/10 NPS 4-5 RPE  - double leg low amplitude vertical jump: 10 reps, RPE 4-5/0 painless  GAIT: Distance walked: 200 Assistive device utilized: walking boot Level of assistance: Modified independence Comments: WNLs c walking boot                                                                                                          TREATMENT DATE:  [facility] Adult PT Treatment:                                                DATE: 04/19/24 Neuromuscular re-ed: SL PF with opp high knee steps Bosu ball balance s challenge, then compass point reaches c L LE  Therapeutic Activity: Rec bike 5 min L4 Sled pushpull 50' x2 70' Side steps c FM 15' x7 each 20# Fwd/bwd and lateral 2 footed jumping 2x10 X jumps 2x5 Snow board  jumps 2x10 Reverse lunges 2x10, 15# Off step heel raises 2x10  [facility] Adult PT Treatment:                                                DATE: 04/14/24 Neuromuscular re-ed: NuStep L5 LE SL PF with opp high knee steps Bosu ball balance s challenge, then compass point reaches c L LE Balance on green theramat c tramp ball toss, fwd and laterally  SL STS 2x10 RLE only cues for eccentric control and weight shifting Therapeutic Activity: Nustep 5 min 5L LE Fwd/bwd and lateral 2 footed jumping 2x10 Single R leg small range fwd/bwd jumping x10, pt reports min pain with this activity SL Leg press 60# 2x10, compared with L the pt found the R much weaker   PATIENT EDUCATION:  Education details: rationale for interventions, HEP  Person educated: Patient Education method: Explanation, Demonstration, Tactile cues, Verbal cues Education comprehension: verbalized understanding, returned demonstration, verbal cues required, tactile cues required, and needs further education     HOME EXERCISE PROGRAM: Access Code: [email or web address] URL: [email or web address] Date: 04/01/2024 Prepared by: Alm Jenny  Program Notes - with heel raise, please incorporate soft bounce without heel contact, as done in clinic  Exercises - Standing Heel Raise with Support  - 1 x daily - 7 x weekly - 2 sets - 8-10 reps - Single Leg Balance with Clock Reach  - 1 x daily - 7 x weekly - 3 sets - 5 reps - Side Step Down with Counter Support  - 3-4 x weekly - 2 sets - 5 reps - Single Leg Heel Raise with Unilateral Counter Support  - 3-4 x weekly - 2-3 sets - 8-12 reps - Single Leg Sit to Stand with Arms Extended  - 3-4 x weekly - 2-3 sets - 8-10 reps  ASSESSMENT:  CLINICAL IMPRESSION: 04/19/24: PT was continued for ROM, strengthening, dynamic stability, and plyometrics. Pt tolerated the prescribed exercises without adverse effects. Will assess R ankle DF AROM and try single leg plyometric exs again the next PT  session.   Per eval - Patient is a 23 y.o. female who was seen today for physical therapy evaluation and treatment for s/p right ankle fracture.  11/12/23: OPEN TREATMENT RIGHT PILON ANKLE FRACTURE WITH FIBULA. Pt presents 8 weeks s/p Sx. The r ankle is swollen and moderately painful with decreased ROM. A HEP was developed. Pt will benefit from skilled PT 2w8 to address impairments to optimize R ankle function with less pain.   OBJECTIVE IMPAIRMENTS: decreased activity tolerance, difficulty walking, decreased ROM, decreased strength, and pain.   ACTIVITY LIMITATIONS: bending, standing, squatting, and locomotion level  PARTICIPATION LIMITATIONS: meal prep, cleaning, shopping, community activity, and occupation  PERSONAL FACTORS: Time since onset of injury/illness/exacerbation are also affecting patient's functional outcome.   REHAB POTENTIAL: Good  CLINICAL DECISION MAKING: Evolving/moderate complexity  EVALUATION COMPLEXITY: Moderate   GOALS:   SHORT TERM GOALS: Target date: 01/29/24 Pt will be Ind in an initial HEP  Baseline:started Goal status: MET  2.  Increase R ankle AROM for DF to neutral PF by 10d  Baseline: see flow sheets Goal status: MET  LONG TERM  GOALS: Target date: 05/27/2024  (updated 04/01/24)  Pt will be Ind in a final HEP to maintain achieved LOF  Baseline:  04/01/24: HEP ONGOING Goal status: ONGOING  2.  Increase R ankle AROM to with 85% of the L for proper function of the R ankle with ambulation Baseline: see flow sheets 04/01/24: AROM WNL Goal status: MET  3.  Pt will demonstrate R single leg balance within 85% of the L for proper R ankle /LE function Baseline:  04/01/24: achieved during treatment Goal status: MET  4. Improve 5xSTS by MCID of 5 and by MCID of 67ft as indication of improved functional mobility  Baseline: To assess when pt is walking with gait pattern exhibiting min gait deficits 02/16/24: 7.8 WNLs, 663 WNLs Goal status: MET- both  are within age norms  5.  Pt  will demonstrate a normalized gait pattern for a distance of 1000' for community mobility Baseline:  04/01/24: pt endorses ability to walk 3-4 miles at a time Goal status: MET  6.  Pt will be able to ascend/descend steps in a reciprocating pattern c 1 HR assist or less for improved function of the R ankle Baseline: Step to pattern 04/01/24: not formally tested Goal status: ONGOING  7. Pt's LEFS score will improve to at least 75/80 for improved perception of function. Baseline:: 33%  04/01/24: 68/80, initial goal met Goal Status: NEW/UPDATED 04/01/24  8. Pt will be able to perform at least 30 single limb heel raises bilat with comparable RPE in order to facilitate improved functional strengthening.   Baseline: see functional testing above  Goal status: INITIAL/NEW 04/01/24  9. Pt will be able to perform at least 30 low amplitude double limb vertical jumps without pain and RPE <4/10 in order to facilitate improved plyometric tolerance for return to PLOF.  Baseline: see functional testing above  Goal status: INITIAL/NEW 04/01/24   PLAN: (updated 04/01/24)  PT FREQUENCY: 1x/week  PT DURATION: 8 weeks  PLANNED INTERVENTIONS: 02835- PT Re-evaluation, 97110-Therapeutic exercises, 97530- Therapeutic activity, 97112- Neuromuscular re-education, 97535- Self Care, 02859- Manual therapy, [phone]- Gait training, [phone]- Electrical stimulation (unattended), Patient/Family education, Balance training, Stair training, Taping, Dry Needling, Joint mobilization, Scar mobilization, Cryotherapy, and Moist heat  PLAN FOR NEXT SESSION: SL strength, low amplitude plyos with conservative progression. Work on barefoot strengthening to assist with climbing. Progress within pt tolerance towards goals of running, jumping, climbing.   Dynver Clemson MS, PT 04/19/24 9:36 AM

## 2024-04-19 ENCOUNTER — Ambulatory Visit

## 2024-04-19 DIAGNOSIS — M6281 Muscle weakness (generalized): Secondary | ICD-10-CM

## 2024-04-19 DIAGNOSIS — M25571 Pain in right ankle and joints of right foot: Secondary | ICD-10-CM

## 2024-04-19 DIAGNOSIS — R262 Difficulty in walking, not elsewhere classified: Secondary | ICD-10-CM

## 2024-04-22 ENCOUNTER — Ambulatory Visit: Payer: Self-pay | Admitting: Obstetrics & Gynecology

## 2024-04-22 NOTE — Progress Notes (Signed)
 LSIL, repeat pap in 12 months

## 2024-04-25 NOTE — Therapy (Incomplete)
 OUTPATIENT PHYSICAL THERAPY TREATMENT   Patient Name: Laurie [name] MRN: [id number] DOB:2001/08/02, 23 y.o., adult Today's Date: 04/25/2024  END OF SESSION:      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/28/2018   Prediabetes 02/08/2016   Vitamin D  deficiency 12/21/2014   Past Surgical History:  Procedure Laterality Date   COLONOSCOPY WITH ESOPHAGOGASTRODUODENOSCOPY (EGD)  10/27/2022   ORIF ANKLE FRACTURE Right 11/12/2023   Procedure: OPEN TREATMENT RIGHT PILON ANKLE FRACTURE WITH FIBULA;  Surgeon: Elsa Lonni SAUNDERS, MD;  Location: [facility] OR;  Service: Orthopedics;  Laterality: Right;   Patient Active Problem List   Diagnosis Date Noted   LGSIL on Pap smear of cervix 04/13/2024   Endocrine disorder, unspecified 11/21/2019   Menstrual cramps 02/08/2016   Frequent headaches 12/21/2014    PCP: Tanda Bleacher, MD  REFERRING PROVIDER: Elsa Lonni SAUNDERS, MD  REFERRING DIAG: s/p right ankle fracture   THERAPY DIAG:  No diagnosis found.  Rationale for Evaluation and Treatment: Rehabilitation  ONSET DATE: 11/02/23 Fx ankle rock climbing with fall from 15 ft. Sx 11/12/23 8 weeks s/p  SUBJECTIVE:   SUBJECTIVE STATEMENT: 04/25/2024 Pt reports noticing occasionally popping of her R ankle. She does not notice it with stressful activities, but with random movements.   PERTINENT HISTORY: See PMH  PAIN:  Are you having pain? 0/10 Worst in past week: 2-3/10 at end of active days  Per eval: Pain location: R ankle Pain description: ache Aggravating factors: prolonged time on feet Relieving factors: Cold pack  PRECAUTIONS: Other: WBAT  RED FLAGS: None   WEIGHT BEARING RESTRICTIONS: WB in walking boot Call Dr. Isiah office for status of Wt bearing exs in standing out of the walking boot while at PT  Per note from evaluating PT: 4/4 call MD who states no CKC WB out of boot yet. Only open chain out of boot at this time Per pt report after  MD visit 01/26/24 cleared to [facility] outside boot Per surgeon communication 03/02/24 via secure chat: okay to progress activity as tolerated, they recommend slow progression with jumping/hopping  FALLS:  Has patient fallen in last 6 months? Yes. Number of falls 1  With injury  LIVING ENVIRONMENT: Lives with: Roommates Lives in: House/apartment Stairs: Yes: External: 15 steps; can reach both Has following equipment at home: None  OCCUPATION: Chartered certified accountant  PLOF: Independent  PATIENT GOALS: Return to function  NEXT MD VISIT: TBD  OBJECTIVE:  Note: Objective measures were completed at Evaluation unless otherwise noted.  DIAGNOSTIC FINDINGS:  DG R Ankle 11/12/23 IMPRESSION: Intraoperative fluoroscopic guidance for ORIF of the distal tibia and fibula.  PATIENT SURVEYS:  LEFS 26/80=33% ability, 67% disability  LEFS 04/01/24 68/80  COGNITION: Overall cognitive status: Within functional limits for tasks assessed     SENSATION: WFL  EDEMA:  Significant swelling present 04/01/24: no significant edema   MUSCLE LENGTH: Hamstrings: Right WNLs deg; Left WNLs deg Debby test: Right NT deg; Left NT deg  POSTURE: No Significant postural limitations  PALPATION: TTP of the R peri-anlke  04/01/24: mild TTP R toe extensors but otherwise unremarkable exam, scar WNL  LOWER EXTREMITY ROM:  Bilat hips and knees WNLs Active ROM Right eval Left eval RT 01/20/24 RT 01/28/24 Rt 02/04/24 Right 03/31/24  Hip flexion        Hip extension        Hip abduction        Hip adduction        Hip internal  rotation        Hip external rotation        Knee flexion        Knee extension        Ankle dorsiflexion A 10 lacking, P 0 A 13 A 3 lacking 0 A 4 A 6 deg  Ankle plantarflexion A 20 A 40 32 A 32 A    Ankle inversion A 9 A 32 A 18   32  Ankle eversion A 12 A 21 A 14   20   (Blank rows = not tested)  LOWER EXTREMITY MMT:  Bilat hip and kness 5/5. R ankle NT MMT Right eval Left eval R/L  04/01/24  Hip flexion     Hip extension     Hip abduction     Hip adduction     Hip internal rotation     Hip external rotation     Knee flexion     Knee extension     Ankle dorsiflexion   5/5  Ankle plantarflexion     Ankle inversion   5/5  Ankle eversion   5/5   (Blank rows = not tested)  LOWER EXTREMITY SPECIAL TESTS:  NT  FUNCTIONAL TESTS: TBA when able to walk with min gait deficit  5 times sit to stand: TBA 2 minute walk test: TBA   03/31/24: - double leg heel raise: 30 reps RPE min fatigue RPE 4/10  - single leg heel raise: L 30 reps RPE 2/10 ; R 20 reps, 2-3/10 NPS 4-5 RPE  - double leg low amplitude vertical jump: 10 reps, RPE 4-5/0 painless  GAIT: Distance walked: 200 Assistive device utilized: walking boot Level of assistance: Modified independence Comments: WNLs c walking boot                                                                                                          TREATMENT DATE:  [facility] Adult PT Treatment:                                                DATE:04/26/24 Neuromuscular re-ed: SL PF with opp high knee steps Bosu ball balance s challenge, then compass point reaches c L LE Therapeutic Activity: Rec bike 5 min L4 Sled pushpull 50' x2 70' Side steps c FM 15' x7 each 20# Fwd/bwd and lateral 2 footed jumping 2x10 X jumps 2x5 Snow board jumps 2x10 Reverse lunges 2x10, 15# Off step heel raises 2x10 Therapeutic Exercise: *** Manual Therapy: *** Neuromuscular re-ed: *** Therapeutic Activity: *** Modalities: *** Self Care: [facility] Adult PT Treatment:                                                DATE: 04/19/24   [facility] Adult PT Treatment:  DATE: 04/14/24 Neuromuscular re-ed: NuStep L5 LE SL PF with opp high knee steps Bosu ball balance s challenge, then compass point reaches c L LE Balance on green theramat c tramp ball toss, fwd and laterally  SL STS 2x10 RLE only cues for  eccentric control and weight shifting Therapeutic Activity: Nustep 5 min 5L LE Fwd/bwd and lateral 2 footed jumping 2x10 Single R leg small range fwd/bwd jumping x10, pt reports min pain with this activity SL Leg press 60# 2x10, compared with L the pt found the R much weaker   PATIENT EDUCATION:  Education details: rationale for interventions, HEP  Person educated: Patient Education method: Explanation, Demonstration, Tactile cues, Verbal cues Education comprehension: verbalized understanding, returned demonstration, verbal cues required, tactile cues required, and needs further education     HOME EXERCISE PROGRAM: Access Code: [email or web address] URL: [email or web address] Date: 04/01/2024 Prepared by: Alm Jenny  Program Notes - with heel raise, please incorporate soft bounce without heel contact, as done in clinic  Exercises - Standing Heel Raise with Support  - 1 x daily - 7 x weekly - 2 sets - 8-10 reps - Single Leg Balance with Clock Reach  - 1 x daily - 7 x weekly - 3 sets - 5 reps - Side Step Down with Counter Support  - 3-4 x weekly - 2 sets - 5 reps - Single Leg Heel Raise with Unilateral Counter Support  - 3-4 x weekly - 2-3 sets - 8-12 reps - Single Leg Sit to Stand with Arms Extended  - 3-4 x weekly - 2-3 sets - 8-10 reps  ASSESSMENT:  CLINICAL IMPRESSION: 04/19/24: PT was continued for ROM, strengthening, dynamic stability, and plyometrics. Pt tolerated the prescribed exercises without adverse effects. Will assess R ankle DF AROM and try single leg plyometric exs again the next PT session.   Per eval - Patient is a 23 y.o. female who was seen today for physical therapy evaluation and treatment for s/p right ankle fracture.  11/12/23: OPEN TREATMENT RIGHT PILON ANKLE FRACTURE WITH FIBULA. Pt presents 8 weeks s/p Sx. The r ankle is swollen and moderately painful with decreased ROM. A HEP was developed. Pt will benefit from skilled PT 2w8 to address impairments to  optimize R ankle function with less pain.   OBJECTIVE IMPAIRMENTS: decreased activity tolerance, difficulty walking, decreased ROM, decreased strength, and pain.   ACTIVITY LIMITATIONS: bending, standing, squatting, and locomotion level  PARTICIPATION LIMITATIONS: meal prep, cleaning, shopping, community activity, and occupation  PERSONAL FACTORS: Time since onset of injury/illness/exacerbation are also affecting patient's functional outcome.   REHAB POTENTIAL: Good  CLINICAL DECISION MAKING: Evolving/moderate complexity  EVALUATION COMPLEXITY: Moderate   GOALS:   SHORT TERM GOALS: Target date: 01/29/24 Pt will be Ind in an initial HEP  Baseline:started Goal status: MET  2.  Increase R ankle AROM for DF to neutral PF by 10d  Baseline: see flow sheets Goal status: MET  LONG TERM GOALS: Target date: 05/27/2024  (updated 04/01/24)  Pt will be Ind in a final HEP to maintain achieved LOF  Baseline:  04/01/24: HEP ONGOING Goal status: ONGOING  2.  Increase R ankle AROM to with 85% of the L for proper function of the R ankle with ambulation Baseline: see flow sheets 04/01/24: AROM WNL Goal status: MET  3.  Pt will demonstrate R single leg balance within 85% of the L for proper R ankle /LE function Baseline:  04/01/24: achieved during treatment Goal status: MET  4. Improve 5xSTS  by MCID of 5 and by MCID of 65ft as indication of improved functional mobility  Baseline: To assess when pt is walking with gait pattern exhibiting min gait deficits 02/16/24: 7.8 WNLs, 663 WNLs Goal status: MET- both are within age norms  5.  Pt  will demonstrate a normalized gait pattern for a distance of 1000' for community mobility Baseline:  04/01/24: pt endorses ability to walk 3-4 miles at a time Goal status: MET  6.  Pt will be able to ascend/descend steps in a reciprocating pattern c 1 HR assist or less for improved function of the R ankle Baseline: Step to pattern 04/01/24: not  formally tested Goal status: ONGOING  7. Pt's LEFS score will improve to at least 75/80 for improved perception of function. Baseline:: 33%  04/01/24: 68/80, initial goal met Goal Status: NEW/UPDATED 04/01/24  8. Pt will be able to perform at least 30 single limb heel raises bilat with comparable RPE in order to facilitate improved functional strengthening.   Baseline: see functional testing above  Goal status: INITIAL/NEW 04/01/24  9. Pt will be able to perform at least 30 low amplitude double limb vertical jumps without pain and RPE <4/10 in order to facilitate improved plyometric tolerance for return to PLOF.  Baseline: see functional testing above  Goal status: INITIAL/NEW 04/01/24   PLAN: (updated 04/01/24)  PT FREQUENCY: 1x/week  PT DURATION: 8 weeks  PLANNED INTERVENTIONS: 02835- PT Re-evaluation, 97110-Therapeutic exercises, 97530- Therapeutic activity, 97112- Neuromuscular re-education, 97535- Self Care, 02859- Manual therapy, [phone]- Gait training, [phone]- Electrical stimulation (unattended), Patient/Family education, Balance training, Stair training, Taping, Dry Needling, Joint mobilization, Scar mobilization, Cryotherapy, and Moist heat  PLAN FOR NEXT SESSION: SL strength, low amplitude plyos with conservative progression. Work on barefoot strengthening to assist with climbing. Progress within pt tolerance towards goals of running, jumping, climbing.   Tuesday Terlecki MS, PT 04/25/24 4:27 PM

## 2024-04-26 ENCOUNTER — Ambulatory Visit

## 2024-05-03 NOTE — Therapy (Incomplete)
 OUTPATIENT PHYSICAL THERAPY TREATMENT   Patient Name: Laurie [name] MRN: [id number] DOB:Oct 02, 2001, 23 y.o., adult Today's Date: 05/03/2024  END OF SESSION:      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/28/2018   Prediabetes 02/08/2016   Vitamin D  deficiency 12/21/2014   Past Surgical History:  Procedure Laterality Date   COLONOSCOPY WITH ESOPHAGOGASTRODUODENOSCOPY (EGD)  10/27/2022   ORIF ANKLE FRACTURE Right 11/12/2023   Procedure: OPEN TREATMENT RIGHT PILON ANKLE FRACTURE WITH FIBULA;  Surgeon: Laurie Lonni SAUNDERS, MD;  Location: [facility] OR;  Service: Orthopedics;  Laterality: Right;   Patient Active Problem List   Diagnosis Date Noted   LGSIL on Pap smear of cervix 04/13/2024   Endocrine disorder, unspecified 11/21/2019   Menstrual cramps 02/08/2016   Frequent headaches 12/21/2014    PCP: Laurie Bleacher, MD  REFERRING PROVIDER: Elsa Lonni SAUNDERS, MD  REFERRING DIAG: s/p right ankle fracture   THERAPY DIAG:  No diagnosis found.  Rationale for Evaluation and Treatment: Rehabilitation  ONSET DATE: 11/02/23 Fx ankle rock climbing with fall from 15 ft. Sx 11/12/23 8 weeks s/p  SUBJECTIVE:   SUBJECTIVE STATEMENT: 05/03/2024: ***  *** Pt reports noticing occasionally popping of her R ankle. She does not notice it with stressful activities, but with random movements.   PERTINENT HISTORY: See PMH  PAIN:  Are you having pain? 0/10 ***  Worst in past week: 2-3/10 at end of active days  Per eval: Pain location: R ankle Pain description: ache Aggravating factors: prolonged time on feet Relieving factors: Cold pack  PRECAUTIONS: Other: WBAT  RED FLAGS: None   WEIGHT BEARING RESTRICTIONS: WB in walking boot Call Dr. Isiah office for status of Wt bearing exs in standing out of the walking boot while at PT  Per note from evaluating PT: 4/4 call MD who states no [facility] WB out of boot yet. Only open chain out of boot at this  time Per pt report after MD visit 01/26/24 cleared to Laurie [name] outside boot Per surgeon communication 03/02/24 via secure chat: okay to progress activity as tolerated, they recommend slow progression with jumping/hopping  FALLS:  Has patient fallen in last 6 months? Yes. Number of falls 1  With injury  LIVING ENVIRONMENT: Lives with: Roommates Lives in: House/apartment Stairs: Yes: External: 15 steps; can reach both Has following equipment at home: None  OCCUPATION: Chartered certified accountant  PLOF: Independent  PATIENT GOALS: Return to function  NEXT MD VISIT: TBD  OBJECTIVE:  Note: Objective measures were completed at Evaluation unless otherwise noted.  DIAGNOSTIC FINDINGS:  DG R Ankle 11/12/23 IMPRESSION: Intraoperative fluoroscopic guidance for ORIF of the distal tibia and fibula.  PATIENT SURVEYS:  LEFS 26/80=33% ability, 67% disability  LEFS 04/01/24 68/80  COGNITION: Overall cognitive status: Within functional limits for tasks assessed     SENSATION: WFL  EDEMA:  Significant swelling present 04/01/24: no significant edema   MUSCLE LENGTH: Hamstrings: Right WNLs deg; Left WNLs deg Laurie [name] Right NT deg; Left NT deg  POSTURE: No Significant postural limitations  PALPATION: TTP of the R peri-anlke  04/01/24: mild TTP R toe extensors but otherwise unremarkable exam, scar WNL  LOWER EXTREMITY ROM:  Bilat hips and knees WNLs Active ROM Right eval Left eval RT 01/20/24 RT 01/28/24 Rt 02/04/24 Right 03/31/24  Hip flexion        Hip extension        Hip abduction        Hip adduction  Hip internal rotation        Hip external rotation        Knee flexion        Knee extension        Ankle dorsiflexion A 10 lacking, P 0 A 13 A 3 lacking 0 A 4 A 6 deg  Ankle plantarflexion A 20 A 40 32 A 32 A    Ankle inversion A 9 A 32 A 18   32  Ankle eversion A 12 A 21 A 14   20   (Blank rows = not tested)  LOWER EXTREMITY MMT:  Bilat hip and kness 5/5. R ankle NT MMT  Right eval Left eval R/L 04/01/24  Hip flexion     Hip extension     Hip abduction     Hip adduction     Hip internal rotation     Hip external rotation     Knee flexion     Knee extension     Ankle dorsiflexion   5/5  Ankle plantarflexion     Ankle inversion   5/5  Ankle eversion   5/5   (Blank rows = not tested)  LOWER EXTREMITY SPECIAL TESTS:  NT  FUNCTIONAL TESTS: TBA when able to walk with min gait deficit  5 times sit to stand: TBA 2 minute walk [name] TBA   03/31/24: - double leg heel raise: 30 reps RPE min fatigue RPE 4/10  - single leg heel raise: L 30 reps RPE 2/10 ; R 20 reps, 2-3/10 NPS 4-5 RPE  - double leg low amplitude vertical jump: 10 reps, RPE 4-5/0 painless  GAIT: Distance walked: 200 Assistive device utilized: walking boot Level of assistance: Modified independence Comments: WNLs c walking boot                                                                                                          TREATMENT DATE:  [facility] Adult PT Treatment:                                                DATE: 05/04/24 Therapeutic Exercise: *** Manual Therapy: *** Neuromuscular re-ed: *** Therapeutic Activity: *** Modalities: *** Self Care: ***    Laurie [name] Adult PT Treatment:                                                DATE: 04/19/24 Neuromuscular re-ed: SL PF with opp high knee steps Bosu ball balance s challenge, then compass point reaches c L LE Therapeutic Activity: Rec bike 5 min L4 Sled pushpull 50' x2 70' Side steps c FM 15' x7 each 20# Fwd/bwd and lateral 2 footed jumping 2x10 X jumps 2x5 Snow board jumps 2x10 Reverse lunges 2x10, 15# Off step heel raises 2x10  Laurie [name]  Adult PT Treatment:                                                DATE: 04/14/24 Neuromuscular re-ed: NuStep L5 LE SL PF with opp high knee steps Bosu ball balance s challenge, then compass point reaches c L LE Balance on green theramat c tramp ball toss, fwd and laterally  SL  STS 2x10 RLE only cues for eccentric control and weight shifting Therapeutic Activity: Nustep 5 min 5L LE Fwd/bwd and lateral 2 footed jumping 2x10 Single R leg small range fwd/bwd jumping x10, pt reports min pain with this activity SL Leg press 60# 2x10, compared with L the pt found the R much weaker   PATIENT EDUCATION:  Education details: rationale for interventions, HEP  Person educated: Patient Education method: Explanation, Demonstration, Tactile cues, Verbal cues Education comprehension: verbalized understanding, returned demonstration, verbal cues required, tactile cues required, and needs further education     HOME EXERCISE PROGRAM: Access Code: [email or web address] URL: [email or web address] Date: 04/01/2024 Prepared by: Alm Jenny  Program Notes - with heel raise, please incorporate soft bounce without heel contact, as done in clinic  Exercises - Standing Heel Raise with Support  - 1 x daily - 7 x weekly - 2 sets - 8-10 reps - Single Leg Balance with Clock Reach  - 1 x daily - 7 x weekly - 3 sets - 5 reps - Side Step Down with Counter Support  - 3-4 x weekly - 2 sets - 5 reps - Single Leg Heel Raise with Unilateral Counter Support  - 3-4 x weekly - 2-3 sets - 8-12 reps - Single Leg Sit to Stand with Arms Extended  - 3-4 x weekly - 2-3 sets - 8-10 reps  ASSESSMENT:  CLINICAL IMPRESSION: 05/03/2024: ***  *** PT was continued for ROM, strengthening, dynamic stability, and plyometrics. Pt tolerated the prescribed exercises without adverse effects. Will assess R ankle DF AROM and try single leg plyometric exs again the next PT session.   Per eval - Patient is a 23 y.o. female who was seen today for physical therapy evaluation and treatment for s/p right ankle fracture.  11/12/23: OPEN TREATMENT RIGHT PILON ANKLE FRACTURE WITH FIBULA. Pt presents 8 weeks s/p Sx. The r ankle is swollen and moderately painful with decreased ROM. A HEP was developed. Pt will benefit from  skilled PT 2w8 to address impairments to optimize R ankle function with less pain.   OBJECTIVE IMPAIRMENTS: decreased activity tolerance, difficulty walking, decreased ROM, decreased strength, and pain.   ACTIVITY LIMITATIONS: bending, standing, squatting, and locomotion level  PARTICIPATION LIMITATIONS: meal prep, cleaning, shopping, community activity, and occupation  PERSONAL FACTORS: Time since onset of injury/illness/exacerbation are also affecting patient's functional outcome.   REHAB POTENTIAL: Good  CLINICAL DECISION MAKING: Evolving/moderate complexity  EVALUATION COMPLEXITY: Moderate   GOALS:   SHORT TERM GOALS: Target date: 01/29/24 Pt will be Ind in an initial HEP  Baseline:started Goal status: MET  2.  Increase R ankle AROM for DF to neutral PF by 10d  Baseline: see flow sheets Goal status: MET  LONG TERM GOALS: Target date: 05/27/2024  (updated 04/01/24)  Pt will be Ind in a final HEP to maintain achieved LOF  Baseline:  04/01/24: HEP ONGOING Goal status: ONGOING  2.  Increase R ankle AROM to with 85% of the L  for proper function of the R ankle with ambulation Baseline: see flow sheets 04/01/24: AROM WNL Goal status: MET  3.  Pt will demonstrate R single leg balance within 85% of the L for proper R ankle /LE function Baseline:  04/01/24: achieved during treatment Goal status: MET  4. Improve 5xSTS by MCID of 5 and by MCID of 47ft as indication of improved functional mobility  Baseline: To assess when pt is walking with gait pattern exhibiting min gait deficits 02/16/24: 7.8 WNLs, 663 WNLs Goal status: MET- both are within age norms  5.  Pt  will demonstrate a normalized gait pattern for a distance of 1000' for community mobility Baseline:  04/01/24: pt endorses ability to walk 3-4 miles at a time Goal status: MET  6.  Pt will be able to ascend/descend steps in a reciprocating pattern c 1 HR assist or less for improved function of the R  ankle Baseline: Step to pattern 04/01/24: not formally tested Goal status: ONGOING  7. Pt's LEFS score will improve to at least 75/80 for improved perception of function. Baseline:: 33%  04/01/24: 68/80, initial goal met Goal Status: NEW/UPDATED 04/01/24  8. Pt will be able to perform at least 30 single limb heel raises bilat with comparable RPE in order to facilitate improved functional strengthening.   Baseline: see functional testing above  Goal status: INITIAL/NEW 04/01/24  9. Pt will be able to perform at least 30 low amplitude double limb vertical jumps without pain and RPE <4/10 in order to facilitate improved plyometric tolerance for return to PLOF.  Baseline: see functional testing above  Goal status: INITIAL/NEW 04/01/24   PLAN: (updated 04/01/24)  PT FREQUENCY: 1x/week  PT DURATION: 8 weeks  PLANNED INTERVENTIONS: 02835- PT Re-evaluation, 97110-Therapeutic exercises, 97530- Therapeutic activity, 97112- Neuromuscular re-education, 97535- Self Care, 02859- Manual therapy, [phone]- Gait training, [phone]- Electrical stimulation (unattended), Patient/Family education, Balance training, Stair training, Taping, Dry Needling, Joint mobilization, Scar mobilization, Cryotherapy, and Moist heat  PLAN FOR NEXT SESSION: SL strength, low amplitude plyos with conservative progression. Work on barefoot strengthening to assist with climbing. Progress within pt tolerance towards goals of running, jumping, climbing.   Alm DELENA Jenny PT, DPT 05/03/2024 11:04 AM

## 2024-05-04 ENCOUNTER — Telehealth: Payer: Self-pay | Admitting: Physical Therapy

## 2024-05-04 ENCOUNTER — Ambulatory Visit: Admitting: Physical Therapy

## 2024-05-04 NOTE — Telephone Encounter (Signed)
 Called pt re: today's missed appt - left voicemail w/ date/time of next appt, provided office call back number and encouraged to reach out with any questions/concerns.

## 2024-05-11 ENCOUNTER — Ambulatory Visit: Admitting: Physical Therapy

## 2024-05-13 DIAGNOSIS — S82871A Displaced pilon fracture of right tibia, initial encounter for closed fracture: Secondary | ICD-10-CM | POA: Diagnosis not present

## 2024-06-21 DIAGNOSIS — H5213 Myopia, bilateral: Secondary | ICD-10-CM | POA: Diagnosis not present
